# Patient Record
Sex: Female | Born: 1970 | Race: Black or African American | Hispanic: No | Marital: Single | Smoking: Never smoker
Health system: Southern US, Community
[De-identification: ages and names within clinical notes are randomized; demographics above are authoritative.]

## PROBLEM LIST (undated history)

## (undated) DIAGNOSIS — I10 Essential (primary) hypertension: Secondary | ICD-10-CM

## (undated) HISTORY — PX: ABDOMINAL HYSTERECTOMY: SHX81

---

## 2013-12-16 ENCOUNTER — Encounter (HOSPITAL_COMMUNITY): Payer: Self-pay | Admitting: Emergency Medicine

## 2013-12-16 ENCOUNTER — Emergency Department (HOSPITAL_COMMUNITY): Payer: No Typology Code available for payment source

## 2013-12-16 ENCOUNTER — Emergency Department (HOSPITAL_COMMUNITY)
Admission: EM | Admit: 2013-12-16 | Discharge: 2013-12-16 | Disposition: A | Discharge status: Home / Self Care | Payer: No Typology Code available for payment source | Attending: Emergency Medicine | Admitting: Emergency Medicine

## 2013-12-16 DIAGNOSIS — I10 Essential (primary) hypertension: Secondary | ICD-10-CM | POA: Insufficient documentation

## 2013-12-16 DIAGNOSIS — Y9389 Activity, other specified: Secondary | ICD-10-CM | POA: Diagnosis not present

## 2013-12-16 DIAGNOSIS — S8012XA Contusion of left lower leg, initial encounter: Secondary | ICD-10-CM | POA: Insufficient documentation

## 2013-12-16 DIAGNOSIS — Y998 Other external cause status: Secondary | ICD-10-CM | POA: Diagnosis not present

## 2013-12-16 DIAGNOSIS — S40022A Contusion of left upper arm, initial encounter: Secondary | ICD-10-CM | POA: Diagnosis not present

## 2013-12-16 DIAGNOSIS — S5012XA Contusion of left forearm, initial encounter: Secondary | ICD-10-CM | POA: Diagnosis not present

## 2013-12-16 DIAGNOSIS — Y92410 Unspecified street and highway as the place of occurrence of the external cause: Secondary | ICD-10-CM | POA: Insufficient documentation

## 2013-12-16 DIAGNOSIS — T148XXA Other injury of unspecified body region, initial encounter: Secondary | ICD-10-CM

## 2013-12-16 DIAGNOSIS — Z79899 Other long term (current) drug therapy: Secondary | ICD-10-CM | POA: Insufficient documentation

## 2013-12-16 DIAGNOSIS — R110 Nausea: Secondary | ICD-10-CM | POA: Diagnosis present

## 2013-12-16 HISTORY — DX: Essential (primary) hypertension: I10

## 2013-12-16 MED ORDER — HYDROCODONE-ACETAMINOPHEN 5-325 MG PO TABS
1.0000 | ORAL_TABLET | Freq: Once | ORAL | Status: AC
  Administered 2013-12-16: 1 via ORAL
  Filled 2013-12-16: qty 1

## 2013-12-16 MED ORDER — HYDROCODONE-ACETAMINOPHEN 5-325 MG PO TABS: 1.0000 | ORAL_TABLET | Freq: Once | ORAL | Status: DC

## 2013-12-16 MED ORDER — HYDROCODONE-ACETAMINOPHEN 5-325 MG PO TABS: 1.0000 | ORAL_TABLET | Freq: Four times a day (QID) | ORAL | Status: DC | PRN

## 2013-12-16 MED ORDER — IBUPROFEN 600 MG PO TABS: 600.0000 mg | ORAL_TABLET | Freq: Four times a day (QID) | ORAL | Status: DC | PRN

## 2013-12-16 MED ORDER — IBUPROFEN 200 MG PO TABS
600.0000 mg | ORAL_TABLET | Freq: Once | ORAL | Status: AC
  Administered 2013-12-16: 600 mg via ORAL
  Filled 2013-12-16: qty 3
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
## 2013-12-16 NOTE — ED Notes (Signed)
Ice applied to left forearm
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
## 2016-02-24 DIAGNOSIS — E663 Overweight: Secondary | ICD-10-CM | POA: Diagnosis not present

## 2016-02-24 DIAGNOSIS — Z1231 Encounter for screening mammogram for malignant neoplasm of breast: Secondary | ICD-10-CM | POA: Diagnosis not present

## 2016-02-24 DIAGNOSIS — Z01411 Encounter for gynecological examination (general) (routine) with abnormal findings: Secondary | ICD-10-CM | POA: Diagnosis not present

## 2016-03-01 DIAGNOSIS — R52 Pain, unspecified: Secondary | ICD-10-CM | POA: Diagnosis not present

## 2016-03-12 DIAGNOSIS — N6489 Other specified disorders of breast: Secondary | ICD-10-CM | POA: Diagnosis not present

## 2016-03-12 DIAGNOSIS — Z1231 Encounter for screening mammogram for malignant neoplasm of breast: Secondary | ICD-10-CM | POA: Diagnosis not present

## 2017-01-27 DIAGNOSIS — Z0000 Encounter for general adult medical examination without abnormal findings: Secondary | ICD-10-CM | POA: Diagnosis not present

## 2017-01-27 DIAGNOSIS — Z01419 Encounter for gynecological examination (general) (routine) without abnormal findings: Secondary | ICD-10-CM | POA: Diagnosis not present

## 2017-01-27 DIAGNOSIS — Z1151 Encounter for screening for human papillomavirus (HPV): Secondary | ICD-10-CM | POA: Diagnosis not present

## 2017-01-27 DIAGNOSIS — Z131 Encounter for screening for diabetes mellitus: Secondary | ICD-10-CM | POA: Diagnosis not present

## 2017-01-27 DIAGNOSIS — Z130 Encounter for screening for diseases of the blood and blood-forming organs and certain disorders involving the immune mechanism: Secondary | ICD-10-CM | POA: Diagnosis not present

## 2017-01-27 DIAGNOSIS — Z6831 Body mass index (BMI) 31.0-31.9, adult: Secondary | ICD-10-CM | POA: Diagnosis not present

## 2017-01-27 DIAGNOSIS — Z13220 Encounter for screening for lipoid disorders: Secondary | ICD-10-CM | POA: Diagnosis not present

## 2017-01-27 DIAGNOSIS — B373 Candidiasis of vulva and vagina: Secondary | ICD-10-CM | POA: Diagnosis not present

## 2017-01-27 DIAGNOSIS — Z1329 Encounter for screening for other suspected endocrine disorder: Secondary | ICD-10-CM | POA: Diagnosis not present

## 2017-02-03 DIAGNOSIS — I10 Essential (primary) hypertension: Secondary | ICD-10-CM | POA: Diagnosis not present

## 2017-02-03 DIAGNOSIS — Z6835 Body mass index (BMI) 35.0-35.9, adult: Secondary | ICD-10-CM | POA: Diagnosis not present

## 2017-02-03 DIAGNOSIS — E668 Other obesity: Secondary | ICD-10-CM | POA: Diagnosis not present

## 2017-03-01 DIAGNOSIS — E668 Other obesity: Secondary | ICD-10-CM | POA: Diagnosis not present

## 2017-03-01 DIAGNOSIS — I10 Essential (primary) hypertension: Secondary | ICD-10-CM | POA: Diagnosis not present

## 2017-03-01 DIAGNOSIS — Z6835 Body mass index (BMI) 35.0-35.9, adult: Secondary | ICD-10-CM | POA: Diagnosis not present

## 2017-03-17 DIAGNOSIS — Z1231 Encounter for screening mammogram for malignant neoplasm of breast: Secondary | ICD-10-CM | POA: Diagnosis not present

## 2017-05-12 DIAGNOSIS — Z6835 Body mass index (BMI) 35.0-35.9, adult: Secondary | ICD-10-CM | POA: Diagnosis not present

## 2017-05-12 DIAGNOSIS — M25511 Pain in right shoulder: Secondary | ICD-10-CM | POA: Diagnosis not present

## 2017-05-24 ENCOUNTER — Ambulatory Visit: Payer: BLUE CROSS/BLUE SHIELD | Admitting: Podiatry

## 2017-05-24 ENCOUNTER — Ambulatory Visit (INDEPENDENT_AMBULATORY_CARE_PROVIDER_SITE_OTHER): Payer: BLUE CROSS/BLUE SHIELD

## 2017-05-24 DIAGNOSIS — M79671 Pain in right foot: Secondary | ICD-10-CM

## 2017-05-24 DIAGNOSIS — M722 Plantar fascial fibromatosis: Secondary | ICD-10-CM | POA: Diagnosis not present

## 2017-05-24 NOTE — Patient Instructions (Signed)

## 2017-05-29 DIAGNOSIS — M722 Plantar fascial fibromatosis: Secondary | ICD-10-CM | POA: Insufficient documentation

## 2017-05-29 NOTE — Progress Notes (Signed)
Subjective:   Patient ID: Mary Guerrero, female   DOB: 47 y.o.   MRN: [id number]   HPI 47 year old female presents the office today for concerns of right heel pain which is been ongoing 1 to 2 weeks.  She states that the pain is worse if she sits down for some time she stands back up.  She denies any recent injury or trauma.  No numbness or tingling.  No swelling that she has noticed and no change in activity level.  She has no other concerns today.   Review of Systems  All other systems reviewed and are negative.  Past Medical History:  Diagnosis Date  . Hypertension     Past Surgical History:  Procedure Laterality Date  . ABDOMINAL HYSTERECTOMY  partial     Current Outpatient Medications:  .  hydrochlorothiazide (HYDRODIURIL) 25 MG tablet, Take 12.5 mg by mouth daily., Disp: , Rfl:  .  HYDROcodone-acetaminophen (NORCO/VICODIN) 5-325 MG per tablet, Take 1 tablet by mouth every 6 (six) hours as needed., Disp: 8 tablet, Rfl: 0 .  ibuprofen (ADVIL,MOTRIN) 600 MG tablet, Take 1 tablet (600 mg total) by mouth every 6 (six) hours as needed., Disp: 30 tablet, Rfl: 0 .  meloxicam (MOBIC) 15 MG tablet, , Disp: , Rfl: 0 .  olmesartan-hydrochlorothiazide (BENICAR HCT) 40-25 MG tablet, , Disp: , Rfl: 2  No Known Allergies  Social History   Socioeconomic History  . Marital status: Single    Spouse name: Not on file  . Number of children: Not on file  . Years of education: Not on file  . Highest education level: Not on file  Occupational History  . Not on file  Social Needs  . Financial resource strain: Not on file  . Food insecurity:    Worry: Not on file    Inability: Not on file  . Transportation needs:    Medical: Not on file    Non-medical: Not on file  Tobacco Use  . Smoking status: Never Smoker  Substance and Sexual Activity  . Alcohol use: Yes    Comment: occassional  . Drug use: No  . Sexual activity: Not on file  Lifestyle  . Physical activity:    Days per  week: Not on file    Minutes per session: Not on file  . Stress: Not on file  Relationships  . Social connections:    Talks on phone: Not on file    Gets together: Not on file    Attends religious service: Not on file    Active member of club or organization: Not on file    Attends meetings of clubs or organizations: Not on file    Relationship status: Not on file  . Intimate partner violence:    Fear of current or ex partner: Not on file    Emotionally abused: Not on file    Physically abused: Not on file    Forced sexual activity: Not on file  Other Topics Concern  . Not on file  Social History Narrative  . Not on file        Objective:  Physical Exam  General: AAO x3, NAD  Dermatological: Skin is warm, dry and supple bilateral. Nails x 10 are well manicured; remaining integument appears unremarkable at this time. There are no open sores, no preulcerative lesions, no rash or signs of infection present.  Vascular: Dorsalis Pedis artery and Posterior Tibial artery pedal pulses are 2/4 bilateral with immedate capillary fill time. Pedal hair  growth present. No varicosities and no lower extremity edema present bilateral. There is no pain with calf compression, swelling, warmth, erythema.   Neruologic: Grossly intact via light touch bilateral. Vibratory intact via tuning fork bilateral. Protective threshold with Semmes Wienstein monofilament intact to all pedal sites bilateral.  Negative Tinel sign  Musculoskeletal: Tenderness to palpation along the plantar medial tubercle of the calcaneus at the insertion of plantar fascia on the right foot. There is no pain along the course of the plantar fascia within the arch of the foot. Plantar fascia appears to be intact. There is no pain with lateral compression of the calcaneus or pain with vibratory sensation. There is no pain along the course or insertion of the achilles tendon. No other areas of tenderness to bilateral lower extremities.  Muscular strength 5/5 in all groups tested bilateral.  Gait: Unassisted, Nonantalgic.       Assessment:   Right heel pain, plantar fasciitis     Plan:  -Treatment options discussed including all alternatives, risks, and complications -Etiology of symptoms were discussed -X-rays were obtained and reviewed with the patient.  No evidence of acute fracture or stress fracture identified today. -Plantar fascial injection performed.  See procedure note below. -Plantar fascial brace dispensed -Prescribed mobic. Discussed side effects of the medication and directed to stop if any are to occur and call the office.  -Stretching, icing exercises daily -Discussed shoe modifications and orthotics -RTC 3 weeks or sooner if needed  Procedure: Injection Tendon/Ligament Discussed alternatives, risks, complications and verbal consent was obtained.  Location: Right plantar fascia at the glabrous junction; medial approach. Skin Prep: Alcohol. Injectate: 0.5cc 0.5% marcaine plain, 0.5 cc 2% lidocaine plain and, 1 cc kenalog 10. Disposition: Patient tolerated procedure well. Injection site dressed with a band-aid.  Post-injection care was discussed and return precautions discussed.     Vivi Barrack DPM

## 2017-05-31 DIAGNOSIS — Z6834 Body mass index (BMI) 34.0-34.9, adult: Secondary | ICD-10-CM | POA: Diagnosis not present

## 2017-05-31 DIAGNOSIS — E669 Obesity, unspecified: Secondary | ICD-10-CM | POA: Diagnosis not present

## 2017-05-31 DIAGNOSIS — I10 Essential (primary) hypertension: Secondary | ICD-10-CM | POA: Diagnosis not present

## 2017-06-07 ENCOUNTER — Ambulatory Visit
Admission: RE | Admit: 2017-06-07 | Discharge: 2017-06-07 | Disposition: A | Discharge status: Home / Self Care | Payer: BLUE CROSS/BLUE SHIELD | Source: Ambulatory Visit | Attending: Family Medicine | Admitting: Family Medicine

## 2017-06-07 ENCOUNTER — Other Ambulatory Visit: Payer: Self-pay | Admitting: Family Medicine

## 2017-06-07 DIAGNOSIS — J189 Pneumonia, unspecified organism: Secondary | ICD-10-CM | POA: Diagnosis not present

## 2017-06-07 DIAGNOSIS — R05 Cough: Secondary | ICD-10-CM

## 2017-06-07 DIAGNOSIS — R062 Wheezing: Secondary | ICD-10-CM | POA: Diagnosis not present

## 2017-06-07 DIAGNOSIS — R059 Cough, unspecified: Secondary | ICD-10-CM

## 2017-06-07 DIAGNOSIS — R509 Fever, unspecified: Secondary | ICD-10-CM | POA: Diagnosis not present

## 2017-06-07 DIAGNOSIS — Z6833 Body mass index (BMI) 33.0-33.9, adult: Secondary | ICD-10-CM | POA: Diagnosis not present

## 2017-06-16 ENCOUNTER — Ambulatory Visit: Payer: BLUE CROSS/BLUE SHIELD | Admitting: Podiatry

## 2017-06-20 ENCOUNTER — Ambulatory Visit: Payer: BLUE CROSS/BLUE SHIELD | Admitting: Podiatry

## 2017-09-08 ENCOUNTER — Telehealth: Payer: Self-pay | Admitting: Podiatry

## 2017-09-08 ENCOUNTER — Encounter: Payer: Self-pay | Admitting: Podiatry

## 2017-09-08 ENCOUNTER — Ambulatory Visit: Payer: BLUE CROSS/BLUE SHIELD | Admitting: Podiatry

## 2017-09-08 DIAGNOSIS — M722 Plantar fascial fibromatosis: Secondary | ICD-10-CM

## 2017-09-08 MED ORDER — METHYLPREDNISOLONE 4 MG PO TBPK: ORAL_TABLET | ORAL | 0 refills | Status: DC

## 2017-09-08 MED ORDER — TRIAMCINOLONE ACETONIDE 10 MG/ML IJ SUSP
10.0000 mg | Freq: Once | INTRAMUSCULAR | Status: AC
  Administered 2017-09-08: 10 mg

## 2017-09-08 NOTE — Progress Notes (Signed)
Subjective: 47 year old female presents the office today for follow-up evaluation of right heel pain, plantar fasciitis.  She states the steroid injection did help for some time however the pain did come back.  She did get the new shoes and inserts which has not been helpful.  She has been on her foot on a frozen water bottle.  Medications doing well, anti-inflammatories.  Overall her pain is about the same at this point.  She has no other concerns.  No recent injury or changes. Denies any systemic complaints such as fevers, chills, nausea, vomiting. No acute changes since last appointment, and no other complaints at this time.   Objective: AAO x3, NAD DP/PT pulses palpable bilaterally, CRT less than 3 seconds There is continuation of tenderness to palpation on the plantar medial tubercle of the [place] the insertion of plantar fascia as well as slightly proximal to this area as well.  There is no pain with lateral compression of the calcaneus and there is no pain to vibratory sensation.  There is no pain on the course or insertion of the Achilles tendon.  Thompson test is negative.  Equinus is present. No open lesions or pre-ulcerative lesions.  No pain with calf compression, swelling, warmth, erythema  Assessment: Right heel pain, plantar fasciitis  Plan: -All treatment options discussed with the patient including all alternatives, risks, complications.  -Today second steroid injection was performed.  See procedure note below.  I dispensed a night splint as well.  Discussed wearing supportive shoes and rest when check with orthotic coverage in regards to getting custom orthotics made for her.  Plan continue with stretching, icing, anti-inflammatories, plantar fascial brace every day for now as well.  I will see her back in 3 weeks to see how she is doing or sooner if any issues are to arise. -Patient encouraged to call the office with any questions, concerns, change in symptoms.   Procedure:  Injection Tendon/Ligament Discussed alternatives, risks, complications and verbal consent was obtained.  Location: Right plantar fascia at the glabrous junction; medial approach. Skin Prep: Alcohol. Injectate: 0.5cc 0.5% marcaine plain, 0.5 cc 2% lidocaine plain and, 1 cc kenalog 10. Disposition: Patient tolerated procedure well. Injection site dressed with a band-aid.  Post-injection care was discussed and return precautions discussed.   Vivi BarrackMatthew R Mahlet Jergens DPM

## 2017-09-08 NOTE — Telephone Encounter (Signed)
RX was suppose to be called into [facility] on the [address] of [address]. Was called into wrong location.

## 2017-09-22 ENCOUNTER — Ambulatory Visit: Payer: BLUE CROSS/BLUE SHIELD | Admitting: Podiatry

## 2017-09-22 ENCOUNTER — Ambulatory Visit (INDEPENDENT_AMBULATORY_CARE_PROVIDER_SITE_OTHER): Payer: BLUE CROSS/BLUE SHIELD | Admitting: Orthotics

## 2017-09-22 DIAGNOSIS — B351 Tinea unguium: Secondary | ICD-10-CM

## 2017-09-22 DIAGNOSIS — M722 Plantar fascial fibromatosis: Secondary | ICD-10-CM

## 2017-09-22 MED ORDER — METHYLPREDNISOLONE 4 MG PO TBPK: ORAL_TABLET | ORAL | 0 refills | Status: DC

## 2017-09-22 NOTE — Progress Notes (Signed)

## 2017-09-26 NOTE — Progress Notes (Signed)
Subjective: 47 year old female presents today requesting orthotics she presents to get motivated inserts.  She states that she still has some pain to the heel.  She has been doing stretching icing exercises regularly.  She also states that she has some toenail fungus and she is asked about different treatment options for this.  Denies any pain in the nails and denies any redness or drainage. Denies any systemic complaints such as fevers, chills, nausea, vomiting. No acute changes since last appointment, and no other complaints at this time.   Objective: AAO x3, NAD DP/PT pulses palpable bilaterally, CRT less than 3 seconds Overall there is continuation of tenderness on the plantar tubercle of the calcaneus and insertion of plantar fascia.  There is no other areas of tenderness.  In terms of his mild yellow discoloration there is callus in the nails I cannot fully evaluate him today.  No open lesions or pre-ulcerative lesions.  No pain with calf compression, swelling, warmth, erythema  Assessment: Chronic heel pain, plantar; likely early onychomycosis  Plan: -All treatment options discussed with the patient including all alternatives, risks, complications.  -At this point she wants to go to proceed with orthotics and she was measured for these today.  Continue stretching, icing exercises daily. -Regards to nail fungus we discussed options as using steroid topical treatment.  She is instructed over-the-counter Fungi-Nail to continue with more prescription topical if needed or oral medications. -Patient encouraged to call the office with any questions, concerns, change in symptoms.   Vivi Barrack DPM

## 2017-10-13 ENCOUNTER — Ambulatory Visit: Payer: BLUE CROSS/BLUE SHIELD | Admitting: Orthotics

## 2017-10-13 DIAGNOSIS — M722 Plantar fascial fibromatosis: Secondary | ICD-10-CM

## 2017-10-13 NOTE — Progress Notes (Signed)
Patient came in today to pick up custom made foot orthotics.  She could not stay to be final fitted as she had other appointments. Gave her f/o and told her to ck back in if needed.

## 2017-12-01 DIAGNOSIS — E668 Other obesity: Secondary | ICD-10-CM | POA: Diagnosis not present

## 2017-12-01 DIAGNOSIS — Z6835 Body mass index (BMI) 35.0-35.9, adult: Secondary | ICD-10-CM | POA: Diagnosis not present

## 2017-12-01 DIAGNOSIS — I10 Essential (primary) hypertension: Secondary | ICD-10-CM | POA: Diagnosis not present

## 2018-10-03 ENCOUNTER — Emergency Department (HOSPITAL_COMMUNITY)
Admission: EM | Admit: 2018-10-03 | Discharge: 2018-10-03 | Discharge status: Against Medical Advice | Payer: 59 | Attending: Emergency Medicine | Admitting: Emergency Medicine

## 2018-10-03 ENCOUNTER — Encounter (HOSPITAL_COMMUNITY): Payer: Self-pay

## 2018-10-03 ENCOUNTER — Other Ambulatory Visit: Payer: Self-pay

## 2018-10-03 DIAGNOSIS — Z5321 Procedure and treatment not carried out due to patient leaving prior to being seen by health care provider: Secondary | ICD-10-CM | POA: Insufficient documentation

## 2018-10-03 DIAGNOSIS — R51 Headache: Secondary | ICD-10-CM | POA: Diagnosis present

## 2018-10-03 NOTE — ED Notes (Signed)
When going in to take patient back to a room. She stated that she was leaving because we took too long. Pt LWBS.

## 2018-10-03 NOTE — ED Triage Notes (Signed)
Pt arrives with [facility] from work c/o of headache x6 hours. She states that she has had this type of headache before and it is usually is caused by HTN. Denies vision changes. Denies N/V. EKG unremarkable with EMS.

## 2018-11-04 ENCOUNTER — Emergency Department (HOSPITAL_COMMUNITY)
Admission: EM | Admit: 2018-11-04 | Discharge: 2018-11-04 | Disposition: A | Discharge status: Home / Self Care | Payer: 59 | Attending: Emergency Medicine | Admitting: Emergency Medicine

## 2018-11-04 DIAGNOSIS — W25XXXA Contact with sharp glass, initial encounter: Secondary | ICD-10-CM | POA: Diagnosis not present

## 2018-11-04 DIAGNOSIS — Y93E5 Activity, floor mopping and cleaning: Secondary | ICD-10-CM | POA: Diagnosis not present

## 2018-11-04 DIAGNOSIS — Z79899 Other long term (current) drug therapy: Secondary | ICD-10-CM | POA: Diagnosis not present

## 2018-11-04 DIAGNOSIS — I10 Essential (primary) hypertension: Secondary | ICD-10-CM | POA: Diagnosis not present

## 2018-11-04 DIAGNOSIS — Y999 Unspecified external cause status: Secondary | ICD-10-CM | POA: Insufficient documentation

## 2018-11-04 DIAGNOSIS — S81811A Laceration without foreign body, right lower leg, initial encounter: Secondary | ICD-10-CM | POA: Insufficient documentation

## 2018-11-04 DIAGNOSIS — Y929 Unspecified place or not applicable: Secondary | ICD-10-CM | POA: Insufficient documentation

## 2018-11-04 NOTE — ED Provider Notes (Signed)
[facility] EMERGENCY DEPARTMENT Provider Note   CSN: [id number] Arrival date & time: 11/04/18  1403     History   Chief Complaint No chief complaint on file.   HPI Mary Guerrero is a 48 y.o. female.     48 year old female presents with complaint of right lower leg laceration.  Patient states that she was cleaning up broken glass at work and when she went to carry out the trash bag the glass broke through the bag and cut her right lower leg.  [facility] and area was anesthetized before urgent care provider decided she needed to have her wound closed in the emergency room.  Tetanus is up-to-date, bleeding is controlled on arrival.  No other injuries, complaints, concerns.     Past Medical History:  Diagnosis Date  . Hypertension     Patient Active Problem List   Diagnosis Date Noted  . Plantar fasciitis 05/29/2017    Past Surgical History:  Procedure Laterality Date  . ABDOMINAL HYSTERECTOMY  partial     OB History   No obstetric history on file.      Home Medications    Prior to Admission medications   Medication Sig Start Date End Date Taking? Authorizing Provider  hydrochlorothiazide (HYDRODIURIL) 25 MG tablet Take 12.5 mg by mouth daily.    [provider]  methylPREDNISolone (MEDROL DOSEPAK) 4 MG TBPK tablet Take as directed 09/22/17   Vivi Barrack, DPM    Family History No family history on file.  Social History Social History   Tobacco Use  . Smoking status: Never Smoker  . Smokeless tobacco: Never Used  Substance Use Topics  . Alcohol use: Yes    Comment: occassional  . Drug use: No     Allergies   Patient has no known allergies.   Review of Systems Review of Systems  Constitutional: Negative for fever.  Musculoskeletal: Negative for arthralgias, gait problem, joint swelling and myalgias.  Skin: Positive for wound.  Allergic/Immunologic: Negative for immunocompromised  state.  Neurological: Negative for weakness and numbness.  Hematological: Does not bruise/bleed easily.  All other systems reviewed and are negative.    Physical Exam Updated Vital Signs BP (!) 119/92 (BP Location: Right Arm)   Pulse 80   Temp 98.2 F (36.8 C) (Oral)   Resp 20   SpO2 97%   Physical Exam Vitals signs and nursing note reviewed.  Constitutional:      General: She is not in acute distress.    Appearance: She is well-developed. She is not diaphoretic.  HENT:     Head: Normocephalic and atraumatic.  Pulmonary:     Effort: Pulmonary effort is normal.  Musculoskeletal: Normal range of motion.        General: Signs of injury present. No swelling, tenderness or deformity.     Right lower leg: She exhibits laceration. She exhibits no tenderness, no bony tenderness and no swelling.       Legs:  Skin:    General: Skin is warm and dry.     Findings: No erythema or rash.  Neurological:     Mental Status: She is alert and oriented to person, place, and time.  Psychiatric:        Behavior: Behavior normal.      ED Treatments / Results  Labs (all labs ordered are listed, but only abnormal results are displayed) Labs Reviewed - No data to display  EKG None  Radiology No  results found.  Procedures .Marland KitchenLaceration Repair  Date/Time: 11/04/2018 2:35 PM Performed by: Tacy Learn, PA-C Authorized by: Tacy Learn, PA-C   Consent:    Consent obtained:  Verbal   Consent given by:  Patient   Risks discussed:  Infection, need for additional repair, pain, poor cosmetic result and poor wound healing   Alternatives discussed:  No treatment and delayed treatment Universal protocol:    Procedure explained and questions answered to patient or proxy's satisfaction: yes     Relevant documents present and verified: yes     Test results available and properly labeled: yes     Imaging studies available: yes     Required blood products, implants, devices, and special  equipment available: yes     Site/side marked: yes     Immediately prior to procedure, a time out was called: yes     Patient identity confirmed:  Verbally with patient Anesthesia (see MAR for exact dosages):    Anesthesia method:  Local infiltration   Local anesthetic:  Lidocaine 1% WITH epi Laceration details:    Location:  Leg   Leg location:  R lower leg   Length (cm):  5   Depth (mm):  3 Repair type:    Repair type:  Simple Pre-procedure details:    Preparation:  Patient was prepped and draped in usual sterile fashion Exploration:    Hemostasis achieved with:  Epinephrine   Wound exploration: wound explored through full range of motion and entire depth of wound probed and visualized     Wound extent: no fascia violation noted, no foreign bodies/material noted, no muscle damage noted, no nerve damage noted, no tendon damage noted, no underlying fracture noted and no vascular damage noted     Contaminated: no   Treatment:    Area cleansed with:  Saline   Amount of cleaning:  Standard   Irrigation solution:  Sterile saline Skin repair:    Repair method:  Sutures and Steri-Strips   Suture size:  4-0   Suture material:  Nylon   Suture technique:  Horizontal mattress   Number of sutures:  3   Number of Steri-Strips:  4 Approximation:    Approximation:  Close Post-procedure details:    Dressing:  Antibiotic ointment and bulky dressing   Patient tolerance of procedure:  Tolerated well, no immediate complications   (including critical care time)  Medications Ordered in ED Medications - No data to display   Initial Impression / Assessment and Plan / ED Course  I have reviewed the triage vital signs and the nursing notes.  Pertinent labs & imaging results that were available during my care of the patient were reviewed by me and considered in my medical decision making (see chart for details).  Clinical Course as of Nov 03 1436  Sat Nov 04, 5351  1863 48 year old female  presents with complaint of right lower leg laceration.  On exam patient is a 5 cm laceration to her right lower leg medially, bleeding is controlled prior to arrival.  Tetanus is up-to-date.  Wound was closed with 3 mattress sutures and 4 Steri-Strips.  Recommend wound check in 2 days, suture removal in 12 days.   [LM]    Clinical Course User Index [LM] Tacy Learn, PA-C      Final Clinical Impressions(s) / ED Diagnoses   Final diagnoses:  Laceration of right lower extremity, initial encounter    ED Discharge Orders    None  Jeannie FendMurphy, Elease Swarm A, PA-C 11/04/18 1438    Raeford RazorKohut, Stephen, MD 11/04/18 1843

## 2018-11-04 NOTE — Discharge Instructions (Addendum)
Keep wound clean and dry.  You may clean with Dove soap on a Q-tip as needed.  Do not use peroxide. You should have a wound check in 2 days with your primary care provider, suture removal in 12 days. Avoid flexing your knee which may pull your stitches tight.

## 2018-11-04 NOTE — ED Triage Notes (Signed)
Pt here from [facility] to get stitches to a a leg lac

## 2020-01-10 IMAGING — CR DG CHEST 2V
2 series · 2 of 2 positions shown · non-contrast
Comparison: None.

CLINICAL DATA: Cough, fever.

EXAM:
CHEST - 2 VIEW

[w chest lat]
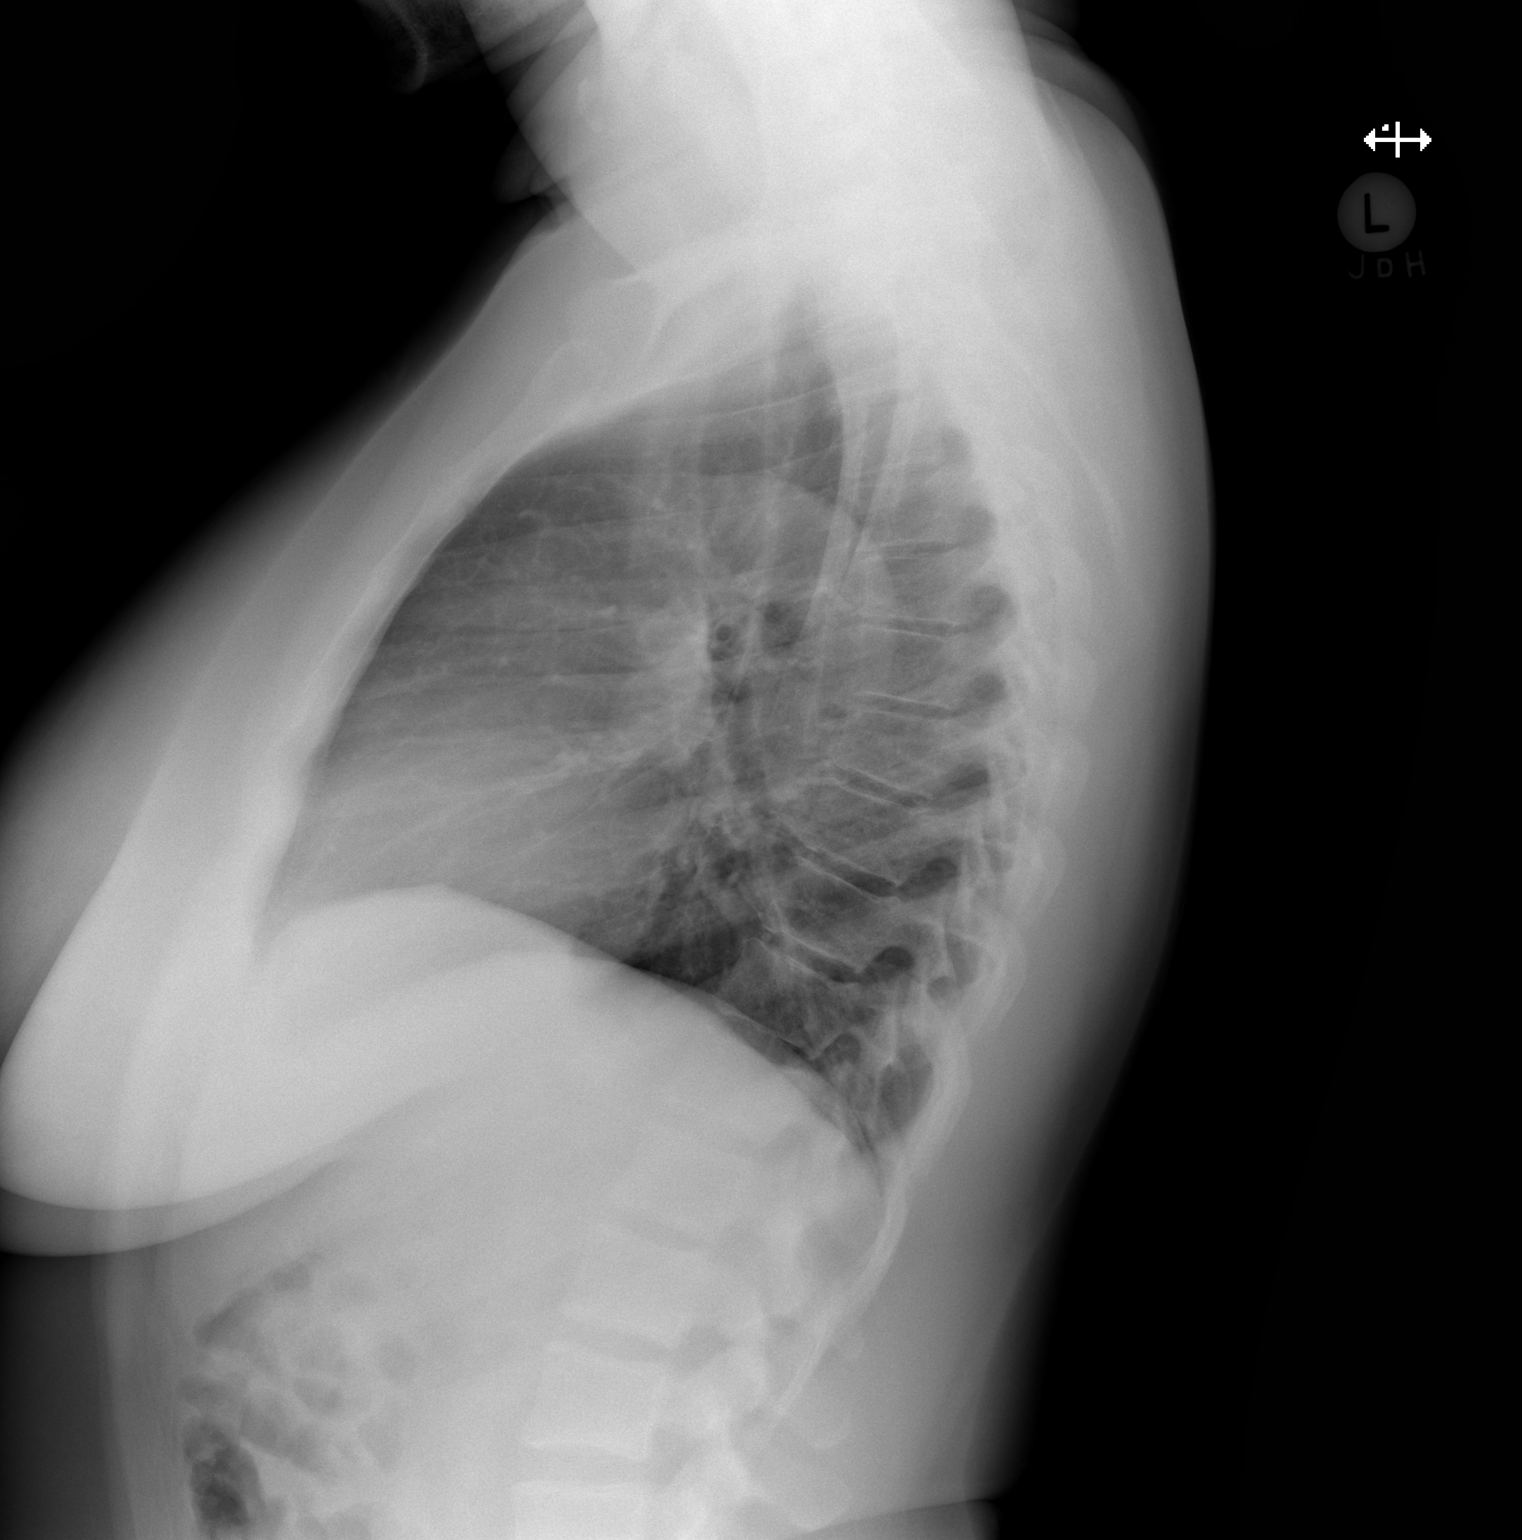

[w chest pa]
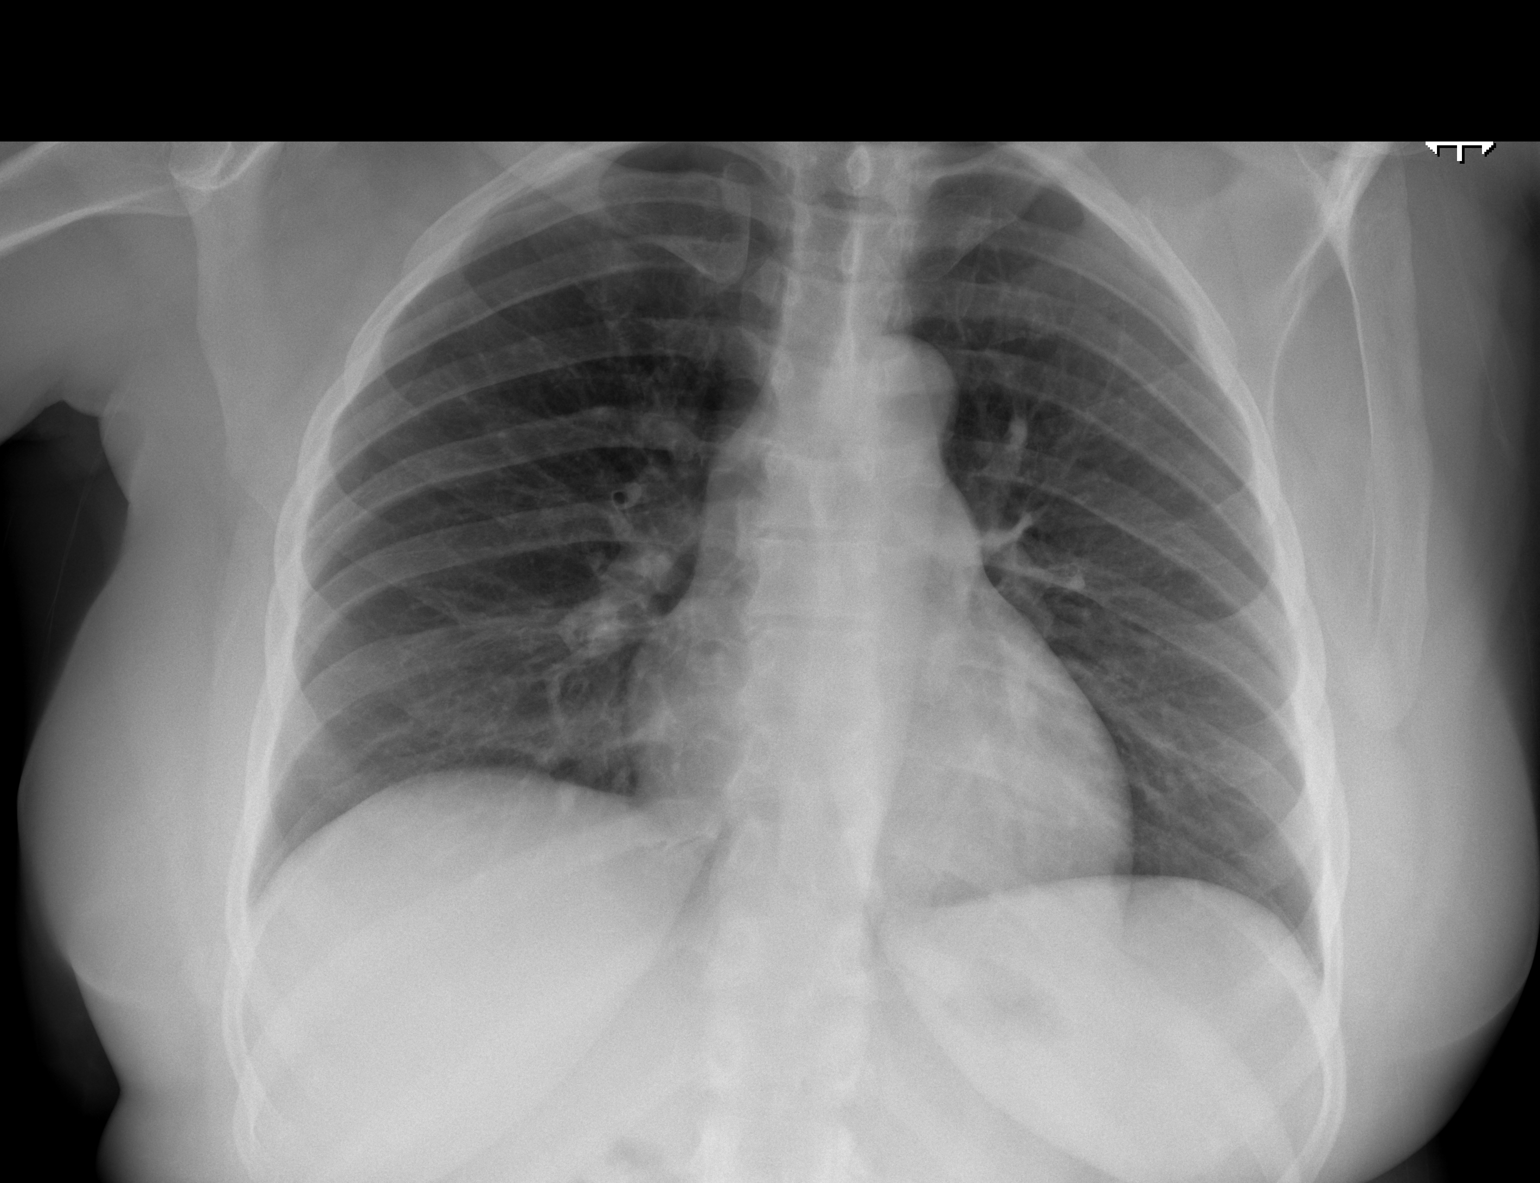

[2 of 2 positions shown; findings below may reference images not displayed]

FINDINGS: The heart size and mediastinal contours are within normal limits.
Both lungs are clear. No pneumothorax or pleural effusion is noted.
The visualized skeletal structures are unremarkable.
IMPRESSION: No active cardiopulmonary disease.

## 2021-06-20 ENCOUNTER — Emergency Department (HOSPITAL_COMMUNITY)
Admission: EM | Admit: 2021-06-20 | Discharge: 2021-06-20 | Disposition: A | Discharge status: Home / Self Care | Payer: 59 | Attending: Emergency Medicine | Admitting: Emergency Medicine

## 2021-06-20 DIAGNOSIS — M533 Sacrococcygeal disorders, not elsewhere classified: Secondary | ICD-10-CM | POA: Insufficient documentation

## 2021-06-20 DIAGNOSIS — M549 Dorsalgia, unspecified: Secondary | ICD-10-CM | POA: Diagnosis present

## 2021-06-20 MED ORDER — HYDROCODONE-ACETAMINOPHEN 5-325 MG PO TABS
1.0000 | ORAL_TABLET | Freq: Once | ORAL | Status: AC
  Administered 2021-06-20: 1 via ORAL
  Filled 2021-06-20: qty 1

## 2021-06-20 MED ORDER — CYCLOBENZAPRINE HCL 10 MG PO TABS: 5.0000 mg | ORAL_TABLET | Freq: Two times a day (BID) | ORAL | 0 refills | Status: DC | PRN

## 2021-06-20 MED ORDER — ONDANSETRON 4 MG PO TBDP
4.0000 mg | ORAL_TABLET | Freq: Once | ORAL | Status: AC
  Administered 2021-06-20: 4 mg via ORAL
  Filled 2021-06-20: qty 1

## 2021-06-20 MED ORDER — OXYCODONE-ACETAMINOPHEN 5-325 MG PO TABS: 1.0000 | ORAL_TABLET | ORAL | Status: DC | PRN

## 2021-06-20 MED ORDER — CELECOXIB 200 MG PO CAPS
200.0000 mg | ORAL_CAPSULE | Freq: Two times a day (BID) | ORAL | 0 refills | Status: DC
Start: 2021-06-20 — End: 2023-01-11

## 2021-06-20 NOTE — ED Provider Notes (Signed)
[facility] EMERGENCY DEPARTMENT Provider Note   CSN: [id number] Arrival date & time: 06/20/21  2134     History  Chief Complaint  Patient presents with   Back Pain    Mary Guerrero is a 51 y.o. female who presents emergency department with chief complaint of low back pain.  Patient complains of pain over the right SI joint.  She states that she had onset of significantly painful back pain 1 week ago.  She pushes a heavy cart at work.  She states that this morning the pain came back on.  She describes it as aching, sharp.  It is specifically localized over the right SI joint.  Pain does not radiate.  She denies leg weakness, saddle anesthesia, injury.  Pain is worsened when she sits for long period time, bends forward or pushes a heavy cart.   Back Pain     Home Medications Prior to Admission medications   Medication Sig Start Date End Date Taking? Authorizing Provider  hydrochlorothiazide (HYDRODIURIL) 25 MG tablet Take 12.5 mg by mouth daily.    [provider]  methylPREDNISolone (MEDROL DOSEPAK) 4 MG TBPK tablet Take as directed 09/22/17   Trula Slade, DPM      Allergies    Patient has no known allergies.    Review of Systems   Review of Systems  Musculoskeletal:  Positive for back pain.   Physical Exam Updated Vital Signs BP 132/80 (BP Location: Right Arm)   Pulse 94   Temp 98.6 F (37 C) (Oral)   Resp 18   SpO2 99%  Physical Exam Vitals and nursing note reviewed.  Constitutional:      General: She is not in acute distress.    Appearance: She is well-developed. She is not diaphoretic.  HENT:     Head: Normocephalic and atraumatic.     Right Ear: External ear normal.     Left Ear: External ear normal.     Nose: Nose normal.     Mouth/Throat:     Mouth: Mucous membranes are moist.  Eyes:     General: No scleral icterus.    Conjunctiva/sclera: Conjunctivae normal.  Cardiovascular:     Rate and Rhythm: Normal rate and  regular rhythm.     Heart sounds: Normal heart sounds. No murmur heard.   No friction rub. No gallop.  Pulmonary:     Effort: Pulmonary effort is normal. No respiratory distress.     Breath sounds: Normal breath sounds.  Abdominal:     General: Bowel sounds are normal. There is no distension.     Palpations: Abdomen is soft. There is no mass.     Tenderness: There is no abdominal tenderness. There is no guarding.  Musculoskeletal:     Cervical back: Normal range of motion.       Back:     Comments: Pain and tenderness over the right SI joint range of motion limited normal leg strength  Skin:    General: Skin is warm and dry.  Neurological:     Mental Status: She is alert and oriented to person, place, and time.  Psychiatric:        Behavior: Behavior normal.    ED Results / Procedures / Treatments   Labs (all labs ordered are listed, but only abnormal results are displayed) Labs Reviewed - No data to display  EKG None  Radiology No results found.  Procedures Procedures    Medications Ordered in ED Medications  oxyCODONE-acetaminophen (PERCOCET/ROXICET) 5-325 MG per tablet 1 tablet (has no administration in time range)    ED Course/ Medical Decision Making/ A&P                           Medical Decision Making Patient with back pain.  No neurological deficits and normal neuro exam.  Patient can walk but states is painful.  No loss of bowel or bladder control.  No concern for cauda equina.  No fever, night sweats, weight loss, h/o cancer, IVDU.  RICE protocol and pain medicine indicated and discussed with patient.    Problems Addressed: Sacroiliac joint pain: chronic illness or injury with exacerbation, progression, or side effects of treatment  Risk Prescription drug management.    Final Clinical Impression(s) / ED Diagnoses Final diagnoses:  Sacroiliac joint pain    Rx / DC Orders ED Discharge Orders     None         Margarita Mail,  PA-C 06/20/21 2259    Sherwood Gambler, MD 06/30/21 [phone]

## 2021-06-20 NOTE — Discharge Instructions (Signed)
Get help right away if: You have weakness, numbness, or tingling in your legs or feet. You lose control of your bladder or bowels.

## 2021-06-20 NOTE — ED Notes (Signed)
Patient verbalizes understanding of discharge instructions. Opportunity for questioning and answers were provided. Armband removed by staff, pt discharged from ED to home 

## 2021-06-20 NOTE — ED Triage Notes (Signed)
Pt presents stating she has chronic back pain and today was worse. Was lying on the couch and felt what she describes as "spasm" in the right lower back.

## 2022-01-19 ENCOUNTER — Encounter (HOSPITAL_COMMUNITY): Payer: Self-pay

## 2022-01-19 ENCOUNTER — Emergency Department (HOSPITAL_COMMUNITY): Payer: 59

## 2022-01-19 ENCOUNTER — Emergency Department (HOSPITAL_COMMUNITY)
Admission: EM | Admit: 2022-01-19 | Discharge: 2022-01-19 | Disposition: A | Discharge status: Home / Self Care | Payer: 59 | Attending: Emergency Medicine | Admitting: Emergency Medicine

## 2022-01-19 ENCOUNTER — Other Ambulatory Visit: Payer: Self-pay

## 2022-01-19 DIAGNOSIS — M549 Dorsalgia, unspecified: Secondary | ICD-10-CM | POA: Insufficient documentation

## 2022-01-19 DIAGNOSIS — I10 Essential (primary) hypertension: Secondary | ICD-10-CM | POA: Diagnosis not present

## 2022-01-19 DIAGNOSIS — Y92481 Parking lot as the place of occurrence of the external cause: Secondary | ICD-10-CM | POA: Diagnosis not present

## 2022-01-19 DIAGNOSIS — R0789 Other chest pain: Secondary | ICD-10-CM | POA: Diagnosis present

## 2022-01-19 DIAGNOSIS — Z95810 Presence of automatic (implantable) cardiac defibrillator: Secondary | ICD-10-CM | POA: Diagnosis not present

## 2022-01-19 DIAGNOSIS — E876 Hypokalemia: Secondary | ICD-10-CM | POA: Diagnosis not present

## 2022-01-19 DIAGNOSIS — Z79899 Other long term (current) drug therapy: Secondary | ICD-10-CM | POA: Diagnosis not present

## 2022-01-19 LAB — BASIC METABOLIC PANEL
Anion gap: 10 (ref 5–15)
BUN: 15 mg/dL (ref 6–20)
CO2: 22 mmol/L (ref 22–32)
Calcium: 8.7 mg/dL — ABNORMAL LOW (ref 8.9–10.3)
Chloride: 106 mmol/L (ref 98–111)
Creatinine, Ser: 0.71 mg/dL (ref 0.44–1.00)
GFR, Estimated: >60 mL/min (ref >60)
Glucose, Bld: 110 mg/dL — ABNORMAL HIGH (ref 70–99)
Potassium: 3.2 mmol/L — ABNORMAL LOW (ref 3.5–5.1)
Sodium: 138 mmol/L (ref 135–145)

## 2022-01-19 LAB — CBC WITH DIFFERENTIAL/PLATELET
Abs Immature Granulocytes: 0.07 10*3/uL (ref 0.00–0.07)
Basophils Absolute: 0 10*3/uL (ref 0.0–0.1)
Basophils Relative: 0 %
Eosinophils Absolute: 0.3 10*3/uL (ref 0.0–0.5)
Eosinophils Relative: 3 %
HCT: 43 % (ref 36.0–46.0)
Hemoglobin: 13.1 g/dL (ref 12.0–15.0)
Immature Granulocytes: 1 %
Lymphocytes Relative: 22 %
Lymphs Abs: 2 10*3/uL (ref 0.7–4.0)
MCH: 24.3 pg — ABNORMAL LOW (ref 26.0–34.0)
MCHC: 30.5 g/dL (ref 30.0–36.0)
MCV: 79.9 fL — ABNORMAL LOW (ref 80.0–100.0)
Monocytes Absolute: 0.6 10*3/uL (ref 0.1–1.0)
Monocytes Relative: 6 %
Neutro Abs: 6.2 10*3/uL (ref 1.7–7.7)
Neutrophils Relative %: 68 %
Platelets: 285 10*3/uL (ref 150–400)
RBC: 5.38 MIL/uL — ABNORMAL HIGH (ref 3.87–5.11)
RDW: 14.9 % (ref 11.5–15.5)
WBC: 9.1 10*3/uL (ref 4.0–10.5)
nRBC: 0 % (ref 0.0–0.2)

## 2022-01-19 LAB — TROPONIN I (HIGH SENSITIVITY)
Troponin I (High Sensitivity): 4 ng/L (ref <18)
Troponin I (High Sensitivity): 6 ng/L (ref <18)

## 2022-01-19 MED ORDER — METHOCARBAMOL 500 MG PO TABS: 500.0000 mg | ORAL_TABLET | Freq: Two times a day (BID) | ORAL | 0 refills | Status: DC

## 2022-01-19 MED ORDER — ACETAMINOPHEN 500 MG PO TABS
1000.0000 mg | ORAL_TABLET | Freq: Once | ORAL | Status: AC
  Administered 2022-01-19: 1000 mg via ORAL
  Filled 2022-01-19: qty 2
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
## 2022-01-19 NOTE — ED Notes (Signed)
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
## 2023-01-11 ENCOUNTER — Encounter (HOSPITAL_BASED_OUTPATIENT_CLINIC_OR_DEPARTMENT_OTHER): Payer: Self-pay

## 2023-01-11 ENCOUNTER — Other Ambulatory Visit: Payer: Self-pay

## 2023-01-11 ENCOUNTER — Emergency Department (HOSPITAL_BASED_OUTPATIENT_CLINIC_OR_DEPARTMENT_OTHER)
Admission: EM | Admit: 2023-01-11 | Discharge: 2023-01-11 | Disposition: A | Discharge status: Home / Self Care | Payer: 59 | Attending: Emergency Medicine | Admitting: Emergency Medicine

## 2023-01-11 DIAGNOSIS — I10 Essential (primary) hypertension: Secondary | ICD-10-CM | POA: Insufficient documentation

## 2023-01-11 DIAGNOSIS — M79642 Pain in left hand: Secondary | ICD-10-CM | POA: Diagnosis present

## 2023-01-11 DIAGNOSIS — M25532 Pain in left wrist: Secondary | ICD-10-CM | POA: Diagnosis not present

## 2023-01-11 MED ORDER — NAPROXEN 250 MG PO TABS
375.0000 mg | ORAL_TABLET | Freq: Once | ORAL | Status: AC
  Administered 2023-01-11: 375 mg via ORAL
  Filled 2023-01-11: qty 2

## 2023-01-11 MED ORDER — NAPROXEN 375 MG PO TABS: 375.0000 mg | ORAL_TABLET | Freq: Two times a day (BID) | ORAL | 0 refills | Status: DC

## 2023-01-11 MED ORDER — PANTOPRAZOLE SODIUM 20 MG PO TBEC: 20.0000 mg | DELAYED_RELEASE_TABLET | Freq: Every day | ORAL | 0 refills | Status: AC

## 2023-01-11 MED ORDER — ACETAMINOPHEN 500 MG PO TABS
1000.0000 mg | ORAL_TABLET | Freq: Once | ORAL | Status: AC
  Administered 2023-01-11: 1000 mg via ORAL
  Filled 2023-01-11: qty 2

## 2023-01-11 NOTE — ED Triage Notes (Signed)
Pt reports she is here today due to left hand pain. Pt reports it started x2 days ago. Pt denies any injury/trauma to her hand. Pt reports it started swelling up. Pt reports feeling a "knot" around the swollen area. Pt denies any fevers. Pulses intact.

## 2023-01-11 NOTE — Discharge Instructions (Addendum)
Thank you for allowing [place] to take care of you today.  We hope you begin feeling better soon.  To-Do: Please follow-up with your primary doctor. In addition to the prescribed Naproxen, you may take 1000 mg of Tylenol every 8 hours as needed. Please return to the Emergency Department or call 911 if you experience chest pain, shortness of breath, severe pain, severe fever, altered mental status, or have any reason to think that you need emergency medical care.  Thank you again.  Hope you feel better soon.  Department of Emergency Medicine [facility]

## 2023-01-11 NOTE — ED Provider Notes (Signed)
[place] [facility] Provider Note  CSN: [id number] Arrival date & time: 01/11/23 6578  Chief Complaint(s) Hand Pain  HPI Mary Guerrero is a 52 y.o. female     Hand Pain This is a new problem. The current episode started 2 days ago. The problem occurs constantly. The problem has been gradually worsening. Pertinent negatives include no chest pain, no abdominal pain, no headaches and no shortness of breath. Exacerbated by: ROM and palpation. Relieved by: Immobility. She has tried nothing for the symptoms.   Patient awoke with pain one morning. No fall or trauma. Does not use needles. No prior h/o gout. No recent infections.  Past Medical History Past Medical History:  Diagnosis Date   Hypertension    Patient Active Problem List   Diagnosis Date Noted   Plantar fasciitis 05/29/2017   Home Medication(s) Prior to Admission medications   Medication Sig Start Date End Date Taking? Authorizing Provider  naproxen (NAPROSYN) 375 MG tablet Take 1 tablet (375 mg total) by mouth 2 (two) times daily. 01/11/23  Yes Garry Bochicchio, Amadeo Garnet, MD  pantoprazole (PROTONIX) 20 MG tablet Take 1 tablet (20 mg total) by mouth daily. 01/11/23  Yes Kailo Kosik, Amadeo Garnet, MD                                                                                                                                    Allergies Patient has no known allergies.  Review of Systems Review of Systems  Respiratory:  Negative for shortness of breath.   Cardiovascular:  Negative for chest pain.  Gastrointestinal:  Negative for abdominal pain.  Neurological:  Negative for headaches.   As noted in HPI  Physical Exam Vital Signs  I have reviewed the triage vital signs BP 133/85   Pulse 72   Temp 98 F (36.7 C) (Oral)   Resp 18   Ht 5\' 6"  (1.676 m)   Wt 95.3 kg   SpO2 97%   BMI 33.89 kg/m   Physical Exam Vitals reviewed.  Constitutional:      General: She is not in  acute distress.    Appearance: She is well-developed. She is not diaphoretic.  HENT:     Head: Normocephalic and atraumatic.     Right Ear: External ear normal.     Left Ear: External ear normal.     Nose: Nose normal.  Eyes:     General: No scleral icterus.    Conjunctiva/sclera: Conjunctivae normal.  Neck:     Trachea: Phonation normal.  Cardiovascular:     Rate and Rhythm: Normal rate and regular rhythm.  Pulmonary:     Effort: Pulmonary effort is normal. No respiratory distress.     Breath sounds: No stridor.  Abdominal:     General: There is no distension.  Musculoskeletal:        General: Normal range of motion.     Right  wrist: Normal pulse.     Left wrist: Swelling and tenderness present. No deformity, lacerations or crepitus. Normal pulse.     Cervical back: Normal range of motion.  Neurological:     Mental Status: She is alert and oriented to person, place, and time.  Psychiatric:        Behavior: Behavior normal.     ED Results and Treatments Labs (all labs ordered are listed, but only abnormal results are displayed) Labs Reviewed - No data to display                                                                                                                       EKG  EKG Interpretation Date/Time:    Ventricular Rate:    PR Interval:    QRS Duration:    QT Interval:    QTC Calculation:   R Axis:      Text Interpretation:         Radiology No results found.  Medications Ordered in ED Medications  naproxen (NAPROSYN) tablet 375 mg (has no administration in time range)  acetaminophen (TYLENOL) tablet 1,000 mg (has no administration in time range)   Procedures Procedures  (including critical care time) Medical Decision Making / ED Course   Medical Decision Making Risk OTC drugs. Prescription drug management.    DDX considered below  Left wrist pain and swelling. Doubt septic arthritis. Possible inflammatory arthritis (such gout or  pseudogout) or sprain. Possible overuse injury as well. No need for xray. Brace provided.  NSAID use recommended.     Final Clinical Impression(s) / ED Diagnoses Final diagnoses:  Left wrist pain   The patient appears reasonably screened and/or stabilized for discharge and I doubt any other medical condition or other [facility] requiring further screening, evaluation, or treatment in the ED at this time. I have discussed the findings, Dx and Tx plan with the patient/family who expressed understanding and agree(s) with the plan. Discharge instructions discussed at length. The patient/family was given strict return precautions who verbalized understanding of the instructions. No further questions at time of discharge.  Disposition: Discharge  Condition: Good  ED Discharge Orders          Ordered    naproxen (NAPROSYN) 375 MG tablet  2 times daily        01/11/23 0549    pantoprazole (PROTONIX) 20 MG tablet  Daily        01/11/23 0549             Follow Up: Aliene Beams, MD [phone] [address] [place] [address] [phone]  Call  to schedule an appointment for close follow up    This chart was dictated using voice recognition software.  Despite best efforts to proofread,  errors can occur which can change the documentation meaning.    Nira Conn, MD 01/11/23 [phone]

## 2023-03-21 ENCOUNTER — Ambulatory Visit: Admission: EM | Admit: 2023-03-21 | Discharge: 2023-03-21 | Disposition: A | Discharge status: Home / Self Care

## 2023-03-21 DIAGNOSIS — M25532 Pain in left wrist: Secondary | ICD-10-CM | POA: Diagnosis not present

## 2023-03-21 DIAGNOSIS — G5602 Carpal tunnel syndrome, left upper limb: Secondary | ICD-10-CM

## 2023-03-21 MED ORDER — TIZANIDINE HCL 4 MG PO TABS: 4.0000 mg | ORAL_TABLET | Freq: Two times a day (BID) | ORAL | 0 refills | Status: DC | PRN

## 2023-03-21 MED ORDER — PREDNISONE 20 MG PO TABS: 40.0000 mg | ORAL_TABLET | Freq: Every day | ORAL | 0 refills | Status: AC

## 2023-03-21 NOTE — ED Triage Notes (Addendum)
 Pt presents with what she describes as excruciating pain in left hand. Pain onset about 2 weeks ago. 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 Pt denies any falls or injuries and says the pain onset randomly one night while sleeping. Pain does radiates towards forearm.

## 2023-03-21 NOTE — ED Provider Notes (Signed)
 [facility]    CSN: [id number] Arrival date & time: 03/21/23  0801      History   Chief Complaint Chief Complaint  Patient presents with   Hand Pain    HPI Mary Guerrero is a 53 y.o. female.  Patient present today with a complaint of left hand pain that is radiating up into the left forearm.  Pain has been ongoing for 2 weeks.  Patient has not had injury.  Patient seen last December at [facility] ED with left wrist pain treated with anti-inflammatories. Current pain is normally improved with nonmovement of her thumb or wrist.  She also was prescribed a wrist splint during her visit at the emergency department which she wears occasionally at bedtime however she is unable to wear while she is working due to the brace and limits her mobility.  She was prescribed anti-inflammatories however reports this did not help at all.  She has not been seen by hand or a orthopedic specialist. Past Medical History:  Diagnosis Date   Hypertension     Patient Active Problem List   Diagnosis Date Noted   Plantar fasciitis 05/29/2017    Past Surgical History:  Procedure Laterality Date   ABDOMINAL HYSTERECTOMY  partial    OB History   No obstetric history on file.      Home Medications    Prior to Admission medications   Medication Sig Start Date End Date Taking? Authorizing Provider  amLODipine (NORVASC) 10 MG tablet Take 10 mg by mouth daily.   Yes [provider]  predniSONE (DELTASONE) 20 MG tablet Take 2 tablets (40 mg total) by mouth daily with breakfast for 5 days. 03/21/23 03/26/23 Yes Bing Neighbors, NP  tiZANidine (ZANAFLEX) 4 MG tablet Take 1 tablet (4 mg total) by mouth 3 times/day as needed-between meals & bedtime for muscle spasms. 03/21/23  Yes Bing Neighbors, NP  naproxen (NAPROSYN) 375 MG tablet Take 1 tablet (375 mg total) by mouth 2 (two) times daily. 01/11/23   Nira Conn, MD  pantoprazole (PROTONIX) 20 MG tablet Take 1 tablet (20 mg  total) by mouth daily. 01/11/23   Nira Conn, MD    Family History No family history on file.  Social History Social History   Tobacco Use   Smoking status: Never   Smokeless tobacco: Never  Substance Use Topics   Alcohol use: Yes    Comment: occassional   Drug use: No     Allergies   Patient has no known allergies.   Review of Systems Review of Systems Pertinent negatives listed in HPI   Physical Exam Triage Vital Signs ED Triage Vitals  Encounter Vitals Group     BP 03/21/23 0822 (!) 166/83     Systolic BP Percentile --      Diastolic BP Percentile --      Pulse Rate 03/21/23 0822 75     Resp 03/21/23 0822 18     Temp 03/21/23 0822 98.3 F (36.8 C)     Temp Source 03/21/23 0822 Oral     SpO2 03/21/23 0822 97 %     Weight --      Height --      Head Circumference --      Peak Flow --      Pain Score 03/21/23 0819 8     Pain Loc --      Pain Education --      Exclude from Growth Chart --  No data found.  Updated Vital Signs BP (!) 166/83 (BP Location: Right Arm) Comment: Pt takes meds to maintain; did not take this morning; dose scheduled for pm.  Pulse 75   Temp 98.3 F (36.8 C) (Oral)   Resp 18   SpO2 97%   Visual Acuity Right Eye Distance:   Left Eye Distance:   Bilateral Distance:    Right Eye Near:   Left Eye Near:    Bilateral Near:     Physical Exam Vitals reviewed.  Constitutional:      Appearance: Normal appearance.  HENT:     Head: Normocephalic and atraumatic.  Eyes:     Extraocular Movements: Extraocular movements intact.     Conjunctiva/sclera: Conjunctivae normal.     Pupils: Pupils are equal, round, and reactive to light.  Cardiovascular:     Rate and Rhythm: Normal rate and regular rhythm.  Pulmonary:     Effort: Pulmonary effort is normal.     Breath sounds: Normal breath sounds.  Musculoskeletal:     Left wrist: Swelling and tenderness present. Decreased range of motion.  Skin:    General: Skin is  warm and dry.     Capillary Refill: Capillary refill takes less than 2 seconds.  Neurological:     General: No focal deficit present.     Mental Status: She is alert and oriented to person, place, and time.      UC Treatments / Results  Labs (all labs ordered are listed, but only abnormal results are displayed) Labs Reviewed - No data to display  EKG   Radiology No results found.  Procedures Procedures (including critical care time)  Medications Ordered in UC Medications - No data to display  Initial Impression / Assessment and Plan / UC Course  I have reviewed the triage vital signs and the nursing notes.  Pertinent labs & imaging results that were available during my care of the patient were reviewed by me and considered in my medical decision making (see chart for details).     Carpal Tunnel of Left wrist, left wrist joint Treating with a burst of prednisone 40 mg daily for 5 days.  Tizanidine 4 mg at bedtime to alleviate nerve related pain.  Patient advised to follow-up at [facility] where she requires nerve conduction testing to completely evaluate the extent of what appears to be carpal tunnel syndrome involving the left wrist.  Encouraged patient to continue wearing wrist splint  at bedtime likely to facilitate improvement of carpal tunnel related pain symptoms.  Patient verbalized understanding and agreement with plan. 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  Tizanidine take prior to going to sleep this will help relax the nerves in your wrist and help improve symptoms of pain.  Continue to wear your wrist splint at nighttime or at bedtime this helps facilitate good blood flow and compresses the inflammation in the nerve that is causing to  experience the pain in your wrist.  [facility] to schedule follow-up evaluation with a hand specialist as she will need nerve conduction testing in order to prescribed the appropriate treatment to resolve your symptoms.     ED Prescriptions     Medication Sig Dispense Auth. Provider  predniSONE (DELTASONE) 20 MG tablet Take 2 tablets (40 mg total) by mouth daily with breakfast for 5 days. 10 tablet Bing Neighbors, NP   tiZANidine (ZANAFLEX) 4 MG tablet Take 1 tablet (4 mg total) by mouth 3 times/day as needed-between meals & bedtime for muscle spasms. 30 tablet Bing Neighbors, NP      PDMP not reviewed this encounter.   Bing Neighbors, NP 03/21/23 [phone]

## 2023-03-21 NOTE — Discharge Instructions (Signed)
 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  Tizanidine take prior to going to sleep this will help relax the nerves in your wrist and help improve symptoms of pain.  Continue to wear your wrist splint at nighttime or at bedtime this helps facilitate good blood flow and compresses the inflammation in the nerve that is causing to experience the pain in your wrist.  [facility] to schedule follow-up evaluation with a hand specialist as she will need nerve conduction testing in order to prescribed the appropriate treatment to resolve your symptoms.

## 2023-04-07 ENCOUNTER — Telehealth: Payer: Self-pay

## 2023-04-07 ENCOUNTER — Other Ambulatory Visit (HOSPITAL_COMMUNITY): Payer: Self-pay

## 2023-04-07 ENCOUNTER — Encounter: Payer: Self-pay | Admitting: Emergency Medicine

## 2023-04-07 ENCOUNTER — Ambulatory Visit: Payer: 59 | Admitting: Emergency Medicine

## 2023-04-07 VITALS — BP 124/82 | HR 82 | Temp 98.6°F | Ht 66.0 in | Wt 213.0 lb

## 2023-04-07 DIAGNOSIS — E66811 Obesity, class 1: Secondary | ICD-10-CM | POA: Diagnosis not present

## 2023-04-07 DIAGNOSIS — I10 Essential (primary) hypertension: Secondary | ICD-10-CM

## 2023-04-07 DIAGNOSIS — E6609 Other obesity due to excess calories: Secondary | ICD-10-CM | POA: Diagnosis not present

## 2023-04-07 DIAGNOSIS — Z7689 Persons encountering health services in other specified circumstances: Secondary | ICD-10-CM

## 2023-04-07 DIAGNOSIS — Z1211 Encounter for screening for malignant neoplasm of colon: Secondary | ICD-10-CM

## 2023-04-07 DIAGNOSIS — Z6834 Body mass index (BMI) 34.0-34.9, adult: Secondary | ICD-10-CM | POA: Diagnosis not present

## 2023-04-07 LAB — COMPREHENSIVE METABOLIC PANEL
ALT: 22 U/L (ref 0–35)
AST: 21 U/L (ref 0–37)
Albumin: 4.5 g/dL (ref 3.5–5.2)
Alkaline Phosphatase: 106 U/L (ref 39–117)
BUN: 14 mg/dL (ref 6–23)
CO2: 30 meq/L (ref 19–32)
Calcium: 9.5 mg/dL (ref 8.4–10.5)
Chloride: 105 meq/L (ref 96–112)
Creatinine, Ser: 0.85 mg/dL (ref 0.40–1.20)
GFR: 79.5 mL/min (ref >60.00)
Glucose, Bld: 108 mg/dL — ABNORMAL HIGH (ref 70–99)
Potassium: 3.7 meq/L (ref 3.5–5.1)
Sodium: 142 meq/L (ref 135–145)
Total Bilirubin: 0.4 mg/dL (ref 0.2–1.2)
Total Protein: 7.1 g/dL (ref 6.0–8.3)

## 2023-04-07 LAB — CBC WITH DIFFERENTIAL/PLATELET
Basophils Absolute: 0 10*3/uL (ref 0.0–0.1)
Basophils Relative: 0.8 % (ref 0.0–3.0)
Eosinophils Absolute: 0.3 10*3/uL (ref 0.0–0.7)
Eosinophils Relative: 4.3 % (ref 0.0–5.0)
HCT: 42.2 % (ref 36.0–46.0)
Hemoglobin: 13.7 g/dL (ref 12.0–15.0)
Lymphocytes Relative: 28.6 % (ref 12.0–46.0)
Lymphs Abs: 1.7 10*3/uL (ref 0.7–4.0)
MCHC: 32.4 g/dL (ref 30.0–36.0)
MCV: 77.7 fl — ABNORMAL LOW (ref 78.0–100.0)
Monocytes Absolute: 0.4 10*3/uL (ref 0.1–1.0)
Monocytes Relative: 7.3 % (ref 3.0–12.0)
Neutro Abs: 3.4 10*3/uL (ref 1.4–7.7)
Neutrophils Relative %: 59 % (ref 43.0–77.0)
Platelets: 291 10*3/uL (ref 150.0–400.0)
RBC: 5.43 Mil/uL — ABNORMAL HIGH (ref 3.87–5.11)
RDW: 14.7 % (ref 11.5–15.5)
WBC: 5.8 10*3/uL (ref 4.0–10.5)

## 2023-04-07 LAB — LIPID PANEL
Cholesterol: 190 mg/dL (ref 0–200)
HDL: 72.4 mg/dL (ref >39.00)
LDL Cholesterol: 107 mg/dL — ABNORMAL HIGH (ref 0–99)
NonHDL: 117.57
Total CHOL/HDL Ratio: 3
Triglycerides: 52 mg/dL (ref 0.0–149.0)
VLDL: 10.4 mg/dL (ref 0.0–40.0)

## 2023-04-07 LAB — TSH: TSH: 1.29 u[IU]/mL (ref 0.35–5.50)

## 2023-04-07 LAB — HEMOGLOBIN A1C: Hgb A1c MFr Bld: 6.2 % (ref 4.6–6.5)

## 2023-04-07 MED ORDER — WEGOVY 0.25 MG/0.5ML ~~LOC~~ SOAJ: 0.2500 mg | SUBCUTANEOUS | 3 refills | Status: DC

## 2023-04-07 NOTE — Patient Instructions (Signed)

## 2023-04-07 NOTE — Assessment & Plan Note (Signed)
 BP Readings from Last 3 Encounters:  04/07/23 124/82  03/21/23 (!) 166/83  01/11/23 133/85  Well-controlled hypertension Cardiovascular risk associated with hypertension discussed Diet and nutrition discussed Continue amlodipine 10 mg daily and metoprolol succinate 25 mg daily Blood work done today Follow-up in 3 months

## 2023-04-07 NOTE — Telephone Encounter (Signed)
 Pharmacy Patient Advocate Encounter   Received notification from CoverMyMeds that prior authorization for [facility] 0.25MG /0.5ML is required/requested.   Insurance verification completed.   The patient is insured through [employer] .   Per test claim: PA required; PA submitted to above mentioned insurance via CoverMyMeds Key/confirmation #/EOC (Key: [id number])  Status is pending

## 2023-04-07 NOTE — Assessment & Plan Note (Signed)
 Cardiovascular risks associated with obesity discussed Diet and nutrition discussed.  Advised to decrease amount of daily carbohydrate intake and daily calories and increase amount of plant based protein in her diet Will benefit from GLP-1 agonist as per insurance's formulary Prescription for [facility] sent to pharmacy of record today

## 2023-04-07 NOTE — Progress Notes (Signed)
 Mary Guerrero 53 y.o.   Chief Complaint  Patient presents with   Establish Care    Patient here just establish care. She was going to [facility] medical seeing Dr. Yetta Barre. She wants everything back up to date with her bp medications.      HISTORY OF PRESENT ILLNESS: This is a 53 y.o. female first visit to this office, here to establish care with me. Has history of hypertension on amlodipine and metoprolol succinate Interested in losing weight No other complaints or medical concerns today.  HPI   Prior to Admission medications   Medication Sig Start Date End Date Taking? Authorizing Provider  amLODipine (NORVASC) 10 MG tablet Take 10 mg by mouth daily.   Yes [provider]  metoprolol succinate (TOPROL-XL) 25 MG 24 hr tablet Take 1 tablet by mouth daily. 02/08/23  Yes [provider]  Semaglutide-Weight Management (WEGOVY) 0.25 MG/0.5ML SOAJ Inject 0.25 mg into the skin once a week. 04/07/23  Yes Lyrah Bradt, Eilleen Kempf, MD  naproxen (NAPROSYN) 375 MG tablet Take 1 tablet (375 mg total) by mouth 2 (two) times daily. Patient not taking: Reported on 04/07/2023 01/11/23   Nira Conn, MD  pantoprazole (PROTONIX) 20 MG tablet Take 1 tablet (20 mg total) by mouth daily. Patient not taking: Reported on 04/07/2023 01/11/23   Nira Conn, MD  tiZANidine (ZANAFLEX) 4 MG tablet Take 1 tablet (4 mg total) by mouth 3 times/day as needed-between meals & bedtime for muscle spasms. Patient not taking: Reported on 04/07/2023 03/21/23   Bing Neighbors, NP    No Known Allergies  Patient Active Problem List   Diagnosis Date Noted   Essential hypertension 04/07/2023   Class 1 obesity due to excess calories with serious comorbidity and body mass index (BMI) of 34.0 to 34.9 in adult 04/07/2023   Plantar fasciitis 05/29/2017    Past Medical History:  Diagnosis Date   Hypertension     Past Surgical History:  Procedure Laterality Date   ABDOMINAL HYSTERECTOMY   partial    Social History   Socioeconomic History   Marital status: Single    Spouse name: Not on file   Number of children: 2   Years of education: Not on file   Highest education level: Some college, no degree  Occupational History   Not on file  Tobacco Use   Smoking status: Never   Smokeless tobacco: Never  Vaping Use   Vaping status: Never Used  Substance and Sexual Activity   Alcohol use: Yes    Alcohol/week: 2.0 standard drinks of alcohol    Types: 2 Glasses of wine per week    Comment: occassional   Drug use: No   Sexual activity: Not Currently  Other Topics Concern   Not on file  Social History Narrative   Not on file   Social Drivers of Health   Financial Resource Strain: Medium Risk (02/03/2022)   Received from [facility]   Overall Financial Resource Strain (CARDIA)    Difficulty of Paying Living Expenses: Somewhat hard  Food Insecurity: Food Insecurity Present (02/03/2022)   Received from [facility]   Hunger Vital Sign    Worried About Running Out of Food in the Last Year: Not on file    Ran Out of Food in the Last Year: Sometimes true  Transportation Needs: No Transportation Needs (02/03/2022)   Received from [facility] - Transportation    Lack of Transportation (Medical): No  Lack of Transportation (Non-Medical): No  Physical Activity: Not on file  Stress: Not on file  Social Connections: Not on file  Intimate Partner Violence: Not on file    Family History  Problem Relation Age of Onset   Ovarian cancer Mother      Review of Systems  Constitutional: Negative.  Negative for chills and fever.  HENT: Negative.  Negative for congestion and sore throat.   Respiratory: Negative.  Negative for cough and shortness of breath.   Cardiovascular: Negative.  Negative for chest pain and palpitations.  Gastrointestinal:  Negative for abdominal pain, diarrhea, nausea and vomiting.  Genitourinary: Negative.  Negative for dysuria  and hematuria.  Skin: Negative.  Negative for rash.  Neurological: Negative.  Negative for dizziness and headaches.  All other systems reviewed and are negative.   Vitals:   04/07/23 1501  BP: 124/82  Pulse: 82  Temp: 98.6 F (37 C)  SpO2: 96%    Physical Exam Vitals reviewed.  Constitutional:      Appearance: Normal appearance. She is obese.  HENT:     Head: Normocephalic.     Right Ear: Tympanic membrane, ear canal and external ear normal.     Left Ear: Tympanic membrane, ear canal and external ear normal.     Mouth/Throat:     Mouth: Mucous membranes are moist.     Pharynx: Oropharynx is clear.  Eyes:     Extraocular Movements: Extraocular movements intact.     Conjunctiva/sclera: Conjunctivae normal.     Pupils: Pupils are equal, round, and reactive to light.  Cardiovascular:     Rate and Rhythm: Normal rate and regular rhythm.     Pulses: Normal pulses.     Heart sounds: Normal heart sounds.  Pulmonary:     Effort: Pulmonary effort is normal.     Breath sounds: Normal breath sounds.  Abdominal:     Palpations: Abdomen is soft.     Tenderness: There is no abdominal tenderness.  Musculoskeletal:     Cervical back: No tenderness.  Lymphadenopathy:     Cervical: No cervical adenopathy.  Skin:    General: Skin is warm and dry.     Capillary Refill: Capillary refill takes less than 2 seconds.  Neurological:     General: No focal deficit present.     Mental Status: She is alert and oriented to person, place, and time.  Psychiatric:        Mood and Affect: Mood normal.        Behavior: Behavior normal.      ASSESSMENT & PLAN: A total of 48 minutes was spent with the patient and counseling/coordination of care regarding preparing for this visit, review of available medical records, establishing care with me, review of chronic medical conditions under management, cardiovascular risks associated with hypertension and obesity, review of all medications and changes  made, education on nutrition, review of health maintenance items, mental health, review of available most recent blood work results, prognosis, documentation and need for follow-up.  Problem List Items Addressed This Visit       Cardiovascular and Mediastinum   Essential hypertension - Primary   BP Readings from Last 3 Encounters:  04/07/23 124/82  03/21/23 (!) 166/83  01/11/23 133/85  Well-controlled hypertension Cardiovascular risk associated with hypertension discussed Diet and nutrition discussed Continue amlodipine 10 mg daily and metoprolol succinate 25 mg daily Blood work done today Follow-up in 3 months      Relevant Medications   metoprolol  succinate (TOPROL-XL) 25 MG 24 hr tablet   Other Relevant Orders   TSH   CBC with Differential/Platelet   Comprehensive metabolic panel   Hemoglobin A1c   Lipid panel     Other   Class 1 obesity due to excess calories with serious comorbidity and body mass index (BMI) of 34.0 to 34.9 in adult   Cardiovascular risks associated with obesity discussed Diet and nutrition discussed.  Advised to decrease amount of daily carbohydrate intake and daily calories and increase amount of plant based protein in her diet Will benefit from GLP-1 agonist as per insurance's formulary Prescription for [facility] sent to pharmacy of record today      Relevant Medications   Semaglutide-Weight Management (WEGOVY) 0.25 MG/0.5ML SOAJ   Other Relevant Orders   TSH   CBC with Differential/Platelet   Comprehensive metabolic panel   Hemoglobin A1c   Lipid panel   Other Visit Diagnoses       Encounter to establish care       Relevant Orders   TSH   CBC with Differential/Platelet   Comprehensive metabolic panel   Hemoglobin A1c   Lipid panel     Screening for colon cancer       Relevant Orders   Cologuard        Patient Instructions  Health Maintenance, Female Adopting a healthy lifestyle and getting preventive care are important in  promoting health and wellness. Ask your health care provider about: The right schedule for you to have regular tests and exams. Things you can do on your own to prevent diseases and keep yourself healthy. What should I know about diet, weight, and exercise? Eat a healthy diet  Eat a diet that includes plenty of vegetables, fruits, low-fat dairy products, and lean protein. Do not eat a lot of foods that are high in solid fats, added sugars, or sodium. Maintain a healthy weight Body mass index (BMI) is used to identify weight problems. It estimates body fat based on height and weight. Your health care provider can help determine your BMI and help you achieve or maintain a healthy weight. Get regular exercise Get regular exercise. This is one of the most important things you can do for your health. Most adults should: Exercise for at least 150 minutes each week. The exercise should increase your heart rate and make you sweat (moderate-intensity exercise). Do strengthening exercises at least twice a week. This is in addition to the moderate-intensity exercise. Spend less time sitting. Even light physical activity can be beneficial. Watch cholesterol and blood lipids Have your blood tested for lipids and cholesterol at 51 years of age, then have this test every 5 years. Have your cholesterol levels checked more often if: Your lipid or cholesterol levels are high. You are older than 51 years of age. You are at high risk for heart disease. What should I know about cancer screening? Depending on your health history and family history, you may need to have cancer screening at various ages. This may include screening for: Breast cancer. Cervical cancer. Colorectal cancer. Skin cancer. Lung cancer. What should I know about heart disease, diabetes, and high blood pressure? Blood pressure and heart disease High blood pressure causes heart disease and increases the risk of stroke. This is more likely  to develop in people who have high blood pressure readings or are overweight. Have your blood pressure checked: Every 3-5 years if you are 23-48 years of age. Every year if you are 40 years  old or older. Diabetes Have regular diabetes screenings. This checks your fasting blood sugar level. Have the screening done: Once every three years after age 42 if you are at a normal weight and have a low risk for diabetes. More often and at a younger age if you are overweight or have a high risk for diabetes. What should I know about preventing infection? Hepatitis B If you have a higher risk for hepatitis B, you should be screened for this virus. Talk with your health care provider to find out if you are at risk for hepatitis B infection. Hepatitis C Testing is recommended for: Everyone born from [birth year] through 1963. Anyone with known risk factors for hepatitis C. Sexually transmitted infections (STIs) Get screened for STIs, including gonorrhea and chlamydia, if: You are sexually active and are younger than 50 years of age. You are older than 51 years of age and your health care provider tells you that you are at risk for this type of infection. Your sexual activity has changed since you were last screened, and you are at increased risk for chlamydia or gonorrhea. Ask your health care provider if you are at risk. Ask your health care provider about whether you are at high risk for HIV. Your health care provider may recommend a prescription medicine to help prevent HIV infection. If you choose to take medicine to prevent HIV, you should first get tested for HIV. You should then be tested every 3 months for as long as you are taking the medicine. Pregnancy If you are about to stop having your period (premenopausal) and you may become pregnant, seek counseling before you get pregnant. Take 400 to 800 micrograms (mcg) of folic acid every day if you become pregnant. Ask for birth control (contraception) if  you want to prevent pregnancy. Osteoporosis and menopause Osteoporosis is a disease in which the bones lose minerals and strength with aging. This can result in bone fractures. If you are 90 years old or older, or if you are at risk for osteoporosis and fractures, ask your health care provider if you should: Be screened for bone loss. Take a calcium or vitamin D supplement to lower your risk of fractures. Be given hormone replacement therapy (HRT) to treat symptoms of menopause. Follow these instructions at home: Alcohol use Do not drink alcohol if: Your health care provider tells you not to drink. You are pregnant, may be pregnant, or are planning to become pregnant. If you drink alcohol: Limit how much you have to: 0-1 drink a day. Know how much alcohol is in your drink. In the [place] one drink equals one 12 oz bottle of beer (355 mL), one 5 oz glass of wine (148 mL), or one 1 oz glass of hard liquor (44 mL). Lifestyle Do not use any products that contain nicotine or tobacco. These products include cigarettes, chewing tobacco, and vaping devices, such as e-cigarettes. If you need help quitting, ask your health care provider. Do not use street drugs. Do not share needles. Ask your health care provider for help if you need support or information about quitting drugs. General instructions Schedule regular health, dental, and eye exams. Stay current with your vaccines. Tell your health care provider if: You often feel depressed. You have ever been abused or do not feel safe at home. Summary Adopting a healthy lifestyle and getting preventive care are important in promoting health and wellness. Follow your health care provider's instructions about healthy diet, exercising, and getting tested or  screened for diseases. Follow your health care provider's instructions on monitoring your cholesterol and blood pressure. This information is not intended to replace advice given to you by your  health care provider. Make sure you discuss any questions you have with your health care provider. Document Revised: 05/26/2020 Document Reviewed: 05/26/2020 Elsevier Patient Education  2024 Elsevier Inc.    Edwina Barth, MD [facility]

## 2023-04-08 NOTE — Telephone Encounter (Signed)
 Pharmacy Patient Advocate Encounter  Received notification from [facility] that Prior Authorization for [facility] 0.25MG /0.5ML  has been DENIED.  Full denial letter will be uploaded to the media tab. See denial reason below.   PA #/Case ID/Reference #: [id number]

## 2023-04-08 NOTE — Telephone Encounter (Signed)
 Copied from CRM [id number]. Topic: General - Other >> Apr 08, 2023  2:30 PM Fredrich Romans wrote: Reason for CRM: Patient called in to ask about wegovy being approved through her insurance >> Apr 08, 2023  2:34 PM Denman George wrote: Pt's prior Berkley Harvey has been started. We are waiting on ins response.

## 2023-04-09 ENCOUNTER — Encounter: Payer: Self-pay | Admitting: Emergency Medicine

## 2023-04-11 ENCOUNTER — Encounter: Payer: Self-pay | Admitting: Radiology

## 2023-04-19 NOTE — Telephone Encounter (Signed)
 Copied from CRM [id number]. Topic: Clinical - Prescription Issue >> Apr 18, 2023  4:47 PM Mary Guerrero wrote: Reason for CRM: Pt called stated insurance denied Semaglutide-Weight Management (WEGOVY) 0.25 MG/0.5ML SOAJ, insurance need more information regarding why pt needs medication. Please call pt at [phone].

## 2023-04-25 ENCOUNTER — Ambulatory Visit: Payer: Self-pay | Admitting: Surgery

## 2023-04-25 LAB — COLOGUARD: COLOGUARD: NEGATIVE

## 2023-05-03 NOTE — Telephone Encounter (Signed)
 Please start PA for pt's wegovy RX

## 2023-05-04 ENCOUNTER — Other Ambulatory Visit: Payer: Self-pay

## 2023-05-04 ENCOUNTER — Emergency Department (HOSPITAL_COMMUNITY)
Admission: EM | Admit: 2023-05-04 | Discharge: 2023-05-04 | Disposition: A | Discharge status: Home / Self Care | Attending: Emergency Medicine | Admitting: Emergency Medicine

## 2023-05-04 DIAGNOSIS — I10 Essential (primary) hypertension: Secondary | ICD-10-CM | POA: Diagnosis not present

## 2023-05-04 DIAGNOSIS — Z79899 Other long term (current) drug therapy: Secondary | ICD-10-CM | POA: Diagnosis not present

## 2023-05-04 DIAGNOSIS — M25562 Pain in left knee: Secondary | ICD-10-CM | POA: Diagnosis present

## 2023-05-04 MED ORDER — PREDNISONE 10 MG (21) PO TBPK: ORAL_TABLET | Freq: Every day | ORAL | 0 refills | Status: DC

## 2023-05-04 MED ORDER — KETOROLAC TROMETHAMINE 15 MG/ML IJ SOLN
15.0000 mg | Freq: Once | INTRAMUSCULAR | Status: AC
  Administered 2023-05-04: 15 mg via INTRAMUSCULAR
  Filled 2023-05-04: qty 1

## 2023-05-04 NOTE — Discharge Instructions (Addendum)
 You were evaluated in the emergency room for left knee pain. A prescription for prednisone was sent into your pharmacy.  You additionally provided a referral to see orthopedics.  You may alternate Tylenol and ibuprofen as needed for pain.  Would additionally recommend applying ice.  Should you experience any new or worsening symptoms including worsening pain, swelling, redness, warmth fevers and chills please return to emergency room.

## 2023-05-04 NOTE — ED Triage Notes (Signed)
 Pt presents with left knee pain. Pain started this morning upon wakening. Pt unable to bear weight on the knee. No injury to the knee prior. Ambulates frequently on the job.

## 2023-05-04 NOTE — ED Provider Notes (Signed)
 [place] EMERGENCY DEPARTMENT AT [facility] Provider Note   CSN: [id number] Arrival date & time: 05/04/23  1902     History  Chief Complaint  Patient presents with   Knee Pain    Mary Guerrero is a 53 y.o. female with history of hypertension presents with complaints of left knee pain.  Patient denies any injury or trauma.  Denies any numbness or tingling.  Pain is worse with ambulation.  No prior surgeries to the affected extremity.  No history of gout.  No IV drug use.   Knee Pain     Past Medical History:  Diagnosis Date   Hypertension      Home Medications Prior to Admission medications   Medication Sig Start Date End Date Taking? Authorizing Provider  predniSONE  (STERAPRED UNI-PAK 21 TAB) 10 MG (21) TBPK tablet Take by mouth daily. Take 6 tabs by mouth daily  for 2 days, then 5 tabs for 2 days, then 4 tabs for 2 days, then 3 tabs for 2 days, 2 tabs for 2 days, then 1 tab by mouth daily for 2 days 05/04/23  Yes Felicie Horning, PA-C  amLODipine (NORVASC) 10 MG tablet Take 10 mg by mouth daily.    [provider]  metoprolol succinate (TOPROL-XL) 25 MG 24 hr tablet Take 1 tablet by mouth daily. 02/08/23   [provider]  naproxen  (NAPROSYN ) 375 MG tablet Take 1 tablet (375 mg total) by mouth 2 (two) times daily. Patient not taking: Reported on 04/07/2023 01/11/23   Lindle Rhea, MD  pantoprazole  (PROTONIX ) 20 MG tablet Take 1 tablet (20 mg total) by mouth daily. Patient not taking: Reported on 04/07/2023 01/11/23   Lindle Rhea, MD  Semaglutide -Weight Management (WEGOVY ) 0.25 MG/0.5ML SOAJ Inject 0.25 mg into the skin once a week. 04/07/23   Elvira Hammersmith, MD  tiZANidine  (ZANAFLEX ) 4 MG tablet Take 1 tablet (4 mg total) by mouth 3 times/day as needed-between meals & bedtime for muscle spasms. Patient not taking: Reported on 04/07/2023 03/21/23   Buena Carmine, NP      Allergies    Patient has no known  allergies.    Review of Systems   Review of Systems  Musculoskeletal:  Positive for myalgias.    Physical Exam Updated Vital Signs BP (!) 149/90 (BP Location: Left Arm)   Pulse 80   Temp 98.2 F (36.8 C) (Oral)   Resp 15   Ht 5\' 5"  (1.651 m)   Wt 97.5 kg   SpO2 100%   BMI 35.78 kg/m  Physical Exam Vitals and nursing note reviewed.  Constitutional:      General: She is not in acute distress.    Appearance: She is well-developed.  HENT:     Head: Normocephalic and atraumatic.  Eyes:     Conjunctiva/sclera: Conjunctivae normal.  Cardiovascular:     Pulses: Normal pulses.  Pulmonary:     Effort: Pulmonary effort is normal. No respiratory distress.  Musculoskeletal:        General: No swelling.     Cervical back: Neck supple.     Comments: Left knee without significant focal tenderness, no significant swelling, no overlying erythema or warmth, tolerates full range of motion with some discomfort at end ranges of flexion, DP pulses symmetric, negative Homans, full ankle range of motion, is able to ambulate   Skin:    General: Skin is warm and dry.     Capillary Refill: Capillary refill takes less than  2 seconds.  Neurological:     Mental Status: She is alert.  Psychiatric:        Mood and Affect: Mood normal.     ED Results / Procedures / Treatments   Labs (all labs ordered are listed, but only abnormal results are displayed) Labs Reviewed - No data to display  EKG None  Radiology No results found.  Procedures Procedures    Medications Ordered in ED Medications  ketorolac  (TORADOL ) 15 MG/ML injection 15 mg (has no administration in time range)    ED Course/ Medical Decision Making/ A&P                                 Medical Decision Making  This patient presents to the ED with chief complaint(s) of knee pain.  The complaint involves an extensive differential diagnosis and also carries with it a high risk of complications and morbidity.   pertinent  past medical history as listed in HPI  The differential diagnosis includes  Septic joint, gout, fracture, dislocation, sprain, musculoskeletal  Additional history obtained: Records reviewed Care Everywhere/External Records  Initial Assessment:   Patient presents with atraumatic left knee pain that started earlier today.  No prior surgeries or injuries to the knee.  No history of gout or IV drug use.  Exam she has no overlying erythema or warmth of the knee, she tolerates full range of motion of the knee with some discomfort at end ranges of flexion.  No suspicion for septic joint or gout.  There is no injury or trauma to report any concern for fracture dislocation.  Negative Homans, pulses are symmetric.  No concern for vascular injury or DVT.  Patient does have some posterior knee discomfort.  Overall most concerning for musculoskeletal etiology such as Baker's cyst.  Provided Ortho follow-up.  Offered brace, patient declines.  Will give a shot of Toradol  and send in prescription for prednisone  taper.  Using shared decision making, decided to defer any imaging at this time.   Independent ECG interpretation:  none  Independent labs interpretation:  The following labs were independently interpreted:  none  Independent visualization and interpretation of imaging: none  Treatment and Reassessment: Patient given shot of Toradol  during visit  Consultations obtained:   none  Disposition:   Patient be discharged home.  Provided follow-up with Ortho. The patient has been appropriately medically screened and/or stabilized in the ED. I have low suspicion for any other emergent medical condition which would require further screening, evaluation or treatment in the ED or require inpatient management. At time of discharge the patient is hemodynamically stable and in no acute distress. I have discussed work-up results and diagnosis with patient and answered all questions. Patient is agreeable with  discharge plan. We discussed strict return precautions for returning to the emergency department and they verbalized understanding.     Social Determinants of Health:   none  This note was dictated with voice recognition software.  Despite best efforts at proofreading, errors may have occurred which can change the documentation meaning.          Final Clinical Impression(s) / ED Diagnoses Final diagnoses:  Acute pain of left knee    Rx / DC Orders ED Discharge Orders          Ordered    predniSONE  (STERAPRED UNI-PAK 21 TAB) 10 MG (21) TBPK tablet  Daily  05/04/23 2141              Felicie Horning, PA-C 05/04/23 2142    Sallyanne Creamer, DO 05/08/23 1334

## 2023-05-12 ENCOUNTER — Ambulatory Visit
Admission: EM | Admit: 2023-05-12 | Discharge: 2023-05-12 | Disposition: A | Discharge status: Home / Self Care | Attending: Family Medicine | Admitting: Family Medicine

## 2023-05-12 ENCOUNTER — Other Ambulatory Visit: Payer: Self-pay

## 2023-05-12 DIAGNOSIS — R739 Hyperglycemia, unspecified: Secondary | ICD-10-CM | POA: Diagnosis not present

## 2023-05-12 DIAGNOSIS — H538 Other visual disturbances: Secondary | ICD-10-CM

## 2023-05-12 LAB — POCT FASTING CBG KUC MANUAL ENTRY: POCT Glucose (KUC): 261 mg/dL — AB (ref 70–99)

## 2023-05-12 NOTE — ED Triage Notes (Signed)
 Pt c/o blurred vision, pt's states feels hot and feels like her face is swelling up. Pt states this started an hour or 2 ago. Pt is diaphoretic.

## 2023-05-12 NOTE — ED Provider Notes (Signed)
 [facility]    CSN: [id number] Arrival date & time: 05/12/23  1942      History   Chief Complaint No chief complaint on file.   HPI Guerrero Guerrero is a 53 y.o. female.   HPI Here for blurry vision.  It started about 2 hours ago while it was still going on she had the feeling that her face was swelling or at least feeling tight.  She also got diaphoretic during that time.  No headache or chest pain or shortness of breath.  The feeling that her face is tight is gone.  The blurry vision is improved.  She felt very hot at first when this was happening.  NKDA  No noted history of diabetes.  She does have a history of hypertension  Past surgical history is significant for hysterectomy Past Medical History:  Diagnosis Date   Hypertension     Patient Active Problem List   Diagnosis Date Noted   Essential hypertension 04/07/2023   Class 1 obesity due to excess calories with serious comorbidity and body mass index (BMI) of 34.0 to 34.9 in adult 04/07/2023   Plantar fasciitis 05/29/2017    Past Surgical History:  Procedure Laterality Date   ABDOMINAL HYSTERECTOMY  partial    OB History   No obstetric history on file.      Home Medications    Prior to Admission medications   Medication Sig Start Date End Date Taking? Authorizing Provider  amLODipine (NORVASC) 10 MG tablet Take 10 mg by mouth daily.    [provider]  metoprolol succinate (TOPROL-XL) 25 MG 24 hr tablet Take 1 tablet by mouth daily. 02/08/23   [provider]  pantoprazole  (PROTONIX ) 20 MG tablet Take 1 tablet (20 mg total) by mouth daily. Patient not taking: Reported on 04/07/2023 01/11/23   Lindle Rhea, MD  Semaglutide -Weight Management (WEGOVY ) 0.25 MG/0.5ML SOAJ Inject 0.25 mg into the skin once a week. 04/07/23   Elvira Hammersmith, MD    Family History Family History  Problem Relation Age of Onset   Ovarian cancer Mother     Social History Social  History   Tobacco Use   Smoking status: Never   Smokeless tobacco: Never  Vaping Use   Vaping status: Never Used  Substance Use Topics   Alcohol use: Yes    Alcohol/week: 2.0 standard drinks of alcohol    Types: 2 Glasses of wine per week    Comment: occassional   Drug use: No     Allergies   Patient has no known allergies.   Review of Systems Review of Systems   Physical Exam Triage Vital Signs ED Triage Vitals  Encounter Vitals Group     BP 05/12/23 1954 134/78     Systolic BP Percentile --      Diastolic BP Percentile --      Pulse Rate 05/12/23 1954 97     Resp 05/12/23 1954 18     Temp 05/12/23 1954 98 F (36.7 C)     Temp Source 05/12/23 1954 Oral     SpO2 05/12/23 1954 93 %     Weight --      Height --      Head Circumference --      Peak Flow --      Pain Score 05/12/23 1952 0     Pain Loc --      Pain Education --      Exclude from Growth Chart --  No data found.  Updated Vital Signs BP 134/78   Pulse 97   Temp 98 F (36.7 C) (Oral)   Resp 18   SpO2 93%   Visual Acuity Right Eye Distance:   Left Eye Distance:   Bilateral Distance:    Right Eye Near:   Left Eye Near:    Bilateral Near:     Physical Exam Vitals reviewed.  Constitutional:      General: She is not in acute distress.    Appearance: She is not toxic-appearing.  HENT:     Nose: Nose normal.     Mouth/Throat:     Mouth: Mucous membranes are moist.     Pharynx: No oropharyngeal exudate or posterior oropharyngeal erythema.  Eyes:     Extraocular Movements: Extraocular movements intact.     Conjunctiva/sclera: Conjunctivae normal.     Pupils: Pupils are equal, round, and reactive to light.  Cardiovascular:     Rate and Rhythm: Normal rate and regular rhythm.     Heart sounds: No murmur heard. Pulmonary:     Effort: Pulmonary effort is normal. No respiratory distress.     Breath sounds: No stridor. No wheezing, rhonchi or rales.  Musculoskeletal:     Cervical back:  Neck supple.  Lymphadenopathy:     Cervical: No cervical adenopathy.  Skin:    Capillary Refill: Capillary refill takes less than 2 seconds.     Coloration: Skin is not jaundiced or pale.  Neurological:     General: No focal deficit present.     Mental Status: She is alert and oriented to person, place, and time.  Psychiatric:        Behavior: Behavior normal.      UC Treatments / Results  Labs (all labs ordered are listed, but only abnormal results are displayed) Labs Reviewed  POCT FASTING CBG [facility] MANUAL ENTRY - Abnormal; Notable for the following components:      Result Value   POCT Glucose ([facility]) 261 (*)    All other components within normal limits  CBC  BASIC METABOLIC PANEL WITH GFR    EKG   Radiology No results found.  Procedures Procedures (including critical care time)  Medications Ordered in UC Medications - No data to display  Initial Impression / Assessment and Plan / UC Course  I have reviewed the triage vital signs and the nursing notes.  Pertinent labs & imaging results that were available during my care of the patient were reviewed by me and considered in my medical decision making (see chart for details).     Her sugar is 261 anything significantly abnormal  on fingerstick.  CBC and BMP are drawn.  Staff will notify her if there is anything significantly abnormal.  She is taking prednisone  and that is most likely why her sugars are elevated.  She had eaten about 30 minutes prior to arriving here.  She has not had elevated sugars in the past.  She will follow-up with primary care.  I discussed with her a diet that can help her sugar be improved.  If she worsens anyway she will go to the emergency room   Final Clinical Impressions(s) / UC Diagnoses   Final diagnoses:  Blurry vision  Hyperglycemia     Discharge Instructions      Your sugar was 261.  I think it is so high because of your being on prednisone .  Plus you had just eaten  before we did that blood work.  In  the next few days be really good on your diet, not eating any sugary foods like cakes or cookies or candy and not drinking any sugary drinks.  Just limit your starches to 1 serving with each meal.  Drink lots of water  If you feel worse anyway go to the emergency room  Our staff will notify you if there is anything significantly abnormal on the blood work, which was a blood count and your electrolytes.       ED Prescriptions   None    PDMP not reviewed this encounter.   Ann Keto, MD 05/12/23 [id number]

## 2023-05-12 NOTE — Discharge Instructions (Signed)
 Your sugar was 261.  I think it is so high because of your being on prednisone .  Plus you had just eaten before we did that blood work.  In the next few days be really good on your diet, not eating any sugary foods like cakes or cookies or candy and not drinking any sugary drinks.  Just limit your starches to 1 serving with each meal.  Drink lots of water  If you feel worse anyway go to the emergency room  Our staff will notify you if there is anything significantly abnormal on the blood work, which was a blood count and your electrolytes.

## 2023-05-14 LAB — CBC
Hematocrit: 45.8 % (ref 34.0–46.6)
Hemoglobin: 14.4 g/dL (ref 11.1–15.9)
MCH: 26.1 pg — ABNORMAL LOW (ref 26.6–33.0)
MCHC: 31.4 g/dL — ABNORMAL LOW (ref 31.5–35.7)
MCV: 83 fL (ref 79–97)
Platelets: 329 10*3/uL (ref 150–450)
RBC: 5.52 x10E6/uL — ABNORMAL HIGH (ref 3.77–5.28)
RDW: 14.1 % (ref 11.7–15.4)
WBC: 10.9 10*3/uL — ABNORMAL HIGH (ref 3.4–10.8)

## 2023-05-14 LAB — BASIC METABOLIC PANEL WITH GFR
BUN/Creatinine Ratio: 14 (ref 9–23)
BUN: 13 mg/dL (ref 6–24)
CO2: 20 mmol/L (ref 20–29)
Calcium: 9.4 mg/dL (ref 8.7–10.2)
Chloride: 104 mmol/L (ref 96–106)
Creatinine, Ser: 0.94 mg/dL (ref 0.57–1.00)
Glucose: 255 mg/dL — ABNORMAL HIGH (ref 70–99)
Potassium: 4.3 mmol/L (ref 3.5–5.2)
Sodium: 144 mmol/L (ref 134–144)
eGFR: 73 mL/min/{1.73_m2} (ref >59)

## 2023-05-16 ENCOUNTER — Telehealth: Payer: Self-pay | Admitting: Emergency Medicine

## 2023-05-16 NOTE — Telephone Encounter (Signed)
 Copied from CRM [id number]. Topic: Clinical - Prescription Issue >> May 16, 2023  2:50 PM Clydene Darner H wrote: Patient called regarding a CRM from 04/08/2023. She stated it has been over a month and she has not heard back. She reported that her Wegovy  prescription was denied by her insurance and is requesting an alternative medication that would be covered. The patient mentioned that [facility] faxed over paperwork last month but she has not received any updates. She also stated she had been communicating with one of the provider's nurses through the portal and was informed that the nurse was out of the office but has not received any follow-up since.  Callback Number: [phone]

## 2023-05-17 ENCOUNTER — Telehealth: Payer: Self-pay | Admitting: Internal Medicine

## 2023-05-17 ENCOUNTER — Encounter: Payer: Self-pay | Admitting: Radiology

## 2023-05-17 ENCOUNTER — Other Ambulatory Visit: Payer: Self-pay | Admitting: Emergency Medicine

## 2023-05-17 ENCOUNTER — Telehealth: Payer: Self-pay

## 2023-05-17 ENCOUNTER — Other Ambulatory Visit (HOSPITAL_COMMUNITY): Payer: Self-pay

## 2023-05-17 MED ORDER — TIRZEPATIDE-WEIGHT MANAGEMENT 2.5 MG/0.5ML ~~LOC~~ SOLN: 2.5000 mg | SUBCUTANEOUS | 5 refills | Status: DC

## 2023-05-17 NOTE — Telephone Encounter (Signed)
 PA request has been Started. New Encounter has been or will be created for follow up. For additional info see Pharmacy Prior Auth telephone encounter from 05/17/23.

## 2023-05-17 NOTE — Telephone Encounter (Signed)
 Pharmacy Patient Advocate Encounter   Received notification from Pt Calls Messages that prior authorization for Zepbound 2.5MG /0.5ML pen-injectors is required/requested.   Insurance verification completed.   The patient is insured through [facility] .   Per test claim: PA required; PA started via CoverMyMeds. KEY [id number] . Waiting for clinical questions to populate.

## 2023-05-17 NOTE — Telephone Encounter (Signed)
 New prescription for Zepbound sent to pharmacy of record today.

## 2023-05-17 NOTE — Telephone Encounter (Signed)
 Clinical questions have been answered and PA submitted. PA currently Pending.

## 2023-05-17 NOTE — Telephone Encounter (Signed)
 Copied from CRM [id number]. Topic: General - Other >> Apr 08, 2023  2:30 PM Adonis Hoot wrote: Reason for CRM: Patient called in to ask about wegovy  being approved through her insurance >> May 16, 2023  2:51 PM Clydene Darner H wrote: Patient called regarding a CRM from 04/08/2023. She stated it has been over a month and she has not heard back. She reported that her Wegovy  prescription was denied by her insurance and is requesting an alternative medication that would be covered. The patient mentioned that [facility] faxed over paperwork last month but she has not received any updates. She also stated she had been communicating with one of the provider's nurses through the portal and was informed that the nurse was out of the office but has not received any follow-up since.  Callback Number: [phone] >> Apr 08, 2023  2:34 PM Ottie Blonder wrote: Pt's prior Siegfried Dress has been started. We are waiting on ins response.

## 2023-05-18 ENCOUNTER — Telehealth: Payer: Self-pay

## 2023-05-18 ENCOUNTER — Encounter: Payer: Self-pay | Admitting: Radiology

## 2023-05-18 NOTE — Telephone Encounter (Signed)
 Copied from CRM [id number]. Topic: Clinical - Prescription Issue >> May 17, 2023  3:59 PM [place] E wrote: Reason for CRM: Patient got a call from [facility] stating the prior authorization for her Zepbound has been denied. Patient stated that her PCP is not putting enough information in the box, as that is what [facility] relayed to her. Patient did not know what specific information they still need. Callback number for patient is [phone].

## 2023-05-18 NOTE — Telephone Encounter (Signed)
 Pharmacy Patient Advocate Encounter  Received notification from [facility] that Prior Authorization for Zepbound 2.5MG /0.5ML pen-injectors  has been DENIED.  Full denial letter will be uploaded to the media tab. See denial reason below.   PA #/Case ID/Reference #: [id number]

## 2023-06-28 ENCOUNTER — Encounter: Payer: Self-pay | Admitting: Emergency Medicine

## 2023-06-28 ENCOUNTER — Ambulatory Visit: Admitting: Emergency Medicine

## 2023-06-28 VITALS — BP 138/92 | HR 93 | Temp 98.5°F | Ht 65.0 in | Wt 212.0 lb

## 2023-06-28 DIAGNOSIS — Z6834 Body mass index (BMI) 34.0-34.9, adult: Secondary | ICD-10-CM | POA: Diagnosis not present

## 2023-06-28 DIAGNOSIS — I10 Essential (primary) hypertension: Secondary | ICD-10-CM

## 2023-06-28 DIAGNOSIS — E66811 Obesity, class 1: Secondary | ICD-10-CM | POA: Diagnosis not present

## 2023-06-28 DIAGNOSIS — E6609 Other obesity due to excess calories: Secondary | ICD-10-CM | POA: Diagnosis not present

## 2023-06-28 NOTE — Assessment & Plan Note (Signed)
 Cardiovascular risks associated with obesity discussed Diet and nutrition discussed.  Advised to decrease amount of daily carbohydrate intake and daily calories and increase amount of plant based protein in her diet Medical insurance not covering GLP-1 agonists Was not able to get Wegovy 

## 2023-06-28 NOTE — Patient Instructions (Signed)
 Hypertension, Adult High blood pressure (hypertension) is when the force of blood pumping through the arteries is too strong. The arteries are the blood vessels that carry blood from the heart throughout the body. Hypertension forces the heart to work harder to pump blood and may cause arteries to become narrow or stiff. Untreated or uncontrolled hypertension can lead to a heart attack, heart failure, a stroke, kidney disease, and other problems. A blood pressure reading consists of a higher number over a lower number. Ideally, your blood pressure should be below 120/80. The first ("top") number is called the systolic pressure. It is a measure of the pressure in your arteries as your heart beats. The second ("bottom") number is called the diastolic pressure. It is a measure of the pressure in your arteries as the heart relaxes. What are the causes? The exact cause of this condition is not known. There are some conditions that result in high blood pressure. What increases the risk? Certain factors may make you more likely to develop high blood pressure. Some of these risk factors are under your control, including: Smoking. Not getting enough exercise or physical activity. Being overweight. Having too much fat, sugar, calories, or salt (sodium) in your diet. Drinking too much alcohol. Other risk factors include: Having a personal history of heart disease, diabetes, high cholesterol, or kidney disease. Stress. Having a family history of high blood pressure and high cholesterol. Having obstructive sleep apnea. Age. The risk increases with age. What are the signs or symptoms? High blood pressure may not cause symptoms. Very high blood pressure (hypertensive crisis) may cause: Headache. Fast or irregular heartbeats (palpitations). Shortness of breath. Nosebleed. Nausea and vomiting. Vision changes. Severe chest pain, dizziness, and seizures. How is this diagnosed? This condition is diagnosed by  measuring your blood pressure while you are seated, with your arm resting on a flat surface, your legs uncrossed, and your feet flat on the floor. The cuff of the blood pressure monitor will be placed directly against the skin of your upper arm at the level of your heart. Blood pressure should be measured at least twice using the same arm. Certain conditions can cause a difference in blood pressure between your right and left arms. If you have a high blood pressure reading during one visit or you have normal blood pressure with other risk factors, you may be asked to: Return on a different day to have your blood pressure checked again. Monitor your blood pressure at home for 1 week or longer. If you are diagnosed with hypertension, you may have other blood or imaging tests to help your health care provider understand your overall risk for other conditions. How is this treated? This condition is treated by making healthy lifestyle changes, such as eating healthy foods, exercising more, and reducing your alcohol intake. You may be referred for counseling on a healthy diet and physical activity. Your health care provider may prescribe medicine if lifestyle changes are not enough to get your blood pressure under control and if: Your systolic blood pressure is above 130. Your diastolic blood pressure is above 80. Your personal target blood pressure may vary depending on your medical conditions, your age, and other factors. Follow these instructions at home: Eating and drinking  Eat a diet that is high in fiber and potassium, and low in sodium, added sugar, and fat. An example of this eating plan is called the DASH diet. DASH stands for Dietary Approaches to Stop Hypertension. To eat this way: Eat  plenty of fresh fruits and vegetables. Try to fill one half of your plate at each meal with fruits and vegetables. Eat whole grains, such as whole-wheat pasta, brown rice, or whole-grain bread. Fill about one  fourth of your plate with whole grains. Eat or drink low-fat dairy products, such as skim milk or low-fat yogurt. Avoid fatty cuts of meat, processed or cured meats, and poultry with skin. Fill about one fourth of your plate with lean proteins, such as fish, chicken without skin, beans, eggs, or tofu. Avoid pre-made and processed foods. These tend to be higher in sodium, added sugar, and fat. Reduce your daily sodium intake. Many people with hypertension should eat less than 1,500 mg of sodium a day. Do not drink alcohol if: Your health care provider tells you not to drink. You are pregnant, may be pregnant, or are planning to become pregnant. If you drink alcohol: Limit how much you have to: 0-1 drink a day for women. 0-2 drinks a day for men. Know how much alcohol is in your drink. In the [place] one drink equals one 12 oz bottle of beer (355 mL), one 5 oz glass of wine (148 mL), or one 1 oz glass of hard liquor (44 mL). Lifestyle  Work with your health care provider to maintain a healthy body weight or to lose weight. Ask what an ideal weight is for you. Get at least 30 minutes of exercise that causes your heart to beat faster (aerobic exercise) most days of the week. Activities may include walking, swimming, or biking. Include exercise to strengthen your muscles (resistance exercise), such as Pilates or lifting weights, as part of your weekly exercise routine. Try to do these types of exercises for 30 minutes at least 3 days a week. Do not use any products that contain nicotine or tobacco. These products include cigarettes, chewing tobacco, and vaping devices, such as e-cigarettes. If you need help quitting, ask your health care provider. Monitor your blood pressure at home as told by your health care provider. Keep all follow-up visits. This is important. Medicines Take over-the-counter and prescription medicines only as told by your health care provider. Follow directions carefully. Blood  pressure medicines must be taken as prescribed. Do not skip doses of blood pressure medicine. Doing this puts you at risk for problems and can make the medicine less effective. Ask your health care provider about side effects or reactions to medicines that you should watch for. Contact a health care provider if you: Think you are having a reaction to a medicine you are taking. Have headaches that keep coming back (recurring). Feel dizzy. Have swelling in your ankles. Have trouble with your vision. Get help right away if you: Develop a severe headache or confusion. Have unusual weakness or numbness. Feel faint. Have severe pain in your chest or abdomen. Vomit repeatedly. Have trouble breathing. These symptoms may be an emergency. Get help right away. Call 911. Do not wait to see if the symptoms will go away. Do not drive yourself to the hospital. Summary Hypertension is when the force of blood pumping through your arteries is too strong. If this condition is not controlled, it may put you at risk for serious complications. Your personal target blood pressure may vary depending on your medical conditions, your age, and other factors. For most people, a normal blood pressure is less than 120/80. Hypertension is treated with lifestyle changes, medicines, or a combination of both. Lifestyle changes include losing weight, eating a healthy,  low-sodium diet, exercising more, and limiting alcohol. This information is not intended to replace advice given to you by your health care provider. Make sure you discuss any questions you have with your health care provider. Document Revised: 11/11/2020 Document Reviewed: 11/11/2020 Elsevier Patient Education  2024 [employer].
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
## 2023-06-28 NOTE — Assessment & Plan Note (Addendum)
 Elevated blood pressure reading in the office but normal at home. Has not taken medication today yet. Cardiovascular risk associated with hypertension discussed Diet and nutrition discussed Continue amlodipine 10 mg daily and metoprolol succinate 25 mg daily Follow-up in 6 months

## 2023-07-05 ENCOUNTER — Encounter: Payer: Self-pay | Admitting: Emergency Medicine

## 2023-07-14 ENCOUNTER — Ambulatory Visit: Admitting: Emergency Medicine

## 2023-12-28 ENCOUNTER — Ambulatory Visit: Admitting: Emergency Medicine

## 2024-01-03 ENCOUNTER — Ambulatory Visit: Admitting: Emergency Medicine

## 2024-01-03 ENCOUNTER — Encounter: Payer: Self-pay | Admitting: Emergency Medicine

## 2024-01-03 VITALS — BP 140/120 | HR 85 | Temp 98.0°F | Ht 65.0 in | Wt 213.0 lb

## 2024-01-03 DIAGNOSIS — I10 Essential (primary) hypertension: Secondary | ICD-10-CM

## 2024-01-03 DIAGNOSIS — E66811 Obesity, class 1: Secondary | ICD-10-CM

## 2024-01-03 MED ORDER — AMLODIPINE BESYLATE 10 MG PO TABS: 10.0000 mg | ORAL_TABLET | Freq: Every day | ORAL | 3 refills | Status: AC

## 2024-01-03 NOTE — Progress Notes (Signed)
 Mary Guerrero 53 y.o.   Chief Complaint  Patient presents with   Follow-up    No concerns     HISTORY OF PRESENT ILLNESS: This is a 53 y.o. female here for 76-month follow-up of hypertension. Has been off amlodipine  for the last 6 months.  Ran out of refills and did not request this office for more. Presently only taking metoprolol succinate 25 mg daily Asymptomatic. No other complaints or medical concerns today.  HPI   Prior to Admission medications  Medication Sig Start Date End Date Taking? Authorizing Provider  metoprolol succinate (TOPROL-XL) 25 MG 24 hr tablet Take 1 tablet by mouth daily. 02/08/23  Yes [provider]  amLODipine  (NORVASC ) 10 MG tablet Take 1 tablet (10 mg total) by mouth daily. 01/03/24   Purcell Emil Schanz, MD  pantoprazole  (PROTONIX ) 20 MG tablet Take 1 tablet (20 mg total) by mouth daily. Patient not taking: Reported on 01/03/2024 01/11/23   Trine Raynell Moder, MD    Allergies[1]  Patient Active Problem List   Diagnosis Date Noted   Essential hypertension 04/07/2023   Class 1 obesity due to excess calories with serious comorbidity and body mass index (BMI) of 34.0 to 34.9 in adult 04/07/2023    Past Medical History:  Diagnosis Date   Hypertension     Past Surgical History:  Procedure Laterality Date   ABDOMINAL HYSTERECTOMY  partial    Social History   Socioeconomic History   Marital status: Single    Spouse name: Not on file   Number of children: 2   Years of education: Not on file   Highest education level: Some college, no degree  Occupational History   Not on file  Tobacco Use   Smoking status: Never   Smokeless tobacco: Never  Vaping Use   Vaping status: Never Used  Substance and Sexual Activity   Alcohol use: Yes    Alcohol/week: 2.0 standard drinks of alcohol    Types: 2 Glasses of wine per week    Comment: occassional   Drug use: No   Sexual activity: Not Currently  Other Topics Concern   Not on  file  Social History Narrative   Not on file   Social Drivers of Health   Tobacco Use: Low Risk (01/03/2024)   Patient History    Smoking Tobacco Use: Never    Smokeless Tobacco Use: Never    Passive Exposure: Not on file  Financial Resource Strain: Medium Risk (06/24/2023)   Overall Financial Resource Strain (CARDIA)    Difficulty of Paying Living Expenses: Somewhat hard  Food Insecurity: Food Insecurity Present (06/24/2023)   Hunger Vital Sign    Worried About Running Out of Food in the Last Year: Sometimes true    Ran Out of Food in the Last Year: Sometimes true  Transportation Needs: No Transportation Needs (06/24/2023)   PRAPARE - Administrator, Civil Service (Medical): No    Lack of Transportation (Non-Medical): No  Physical Activity: Insufficiently Active (06/24/2023)   Exercise Vital Sign    Days of Exercise per Week: 1 day    Minutes of Exercise per Session: 10 min  Stress: No Stress Concern Present (06/24/2023)   [employer] of Occupational Health - Occupational Stress Questionnaire    Feeling of Stress : Only a little  Social Connections: Socially Isolated (06/24/2023)   Social Connection and Isolation Panel    Frequency of Communication with Friends and Family: More than three times a week  Frequency of Social Gatherings with Friends and Family: Once a week    Attends Religious Services: Never    Database Administrator or Organizations: No    Attends Engineer, Structural: Not on file    Marital Status: Divorced  Intimate Partner Violence: Not on file  Depression (PHQ2-9): Low Risk (01/03/2024)   Depression (PHQ2-9)    PHQ-2 Score: 0  Alcohol Screen: Low Risk (06/24/2023)   Alcohol Screen    Last Alcohol Screening Score (AUDIT): 1  Housing: High Risk (06/24/2023)   Housing Stability Vital Sign    Unable to Pay for Housing in the Last Year: Yes    Number of Times Moved in the Last Year: 0    Homeless in the Last Year: No  Utilities: Not on file   Health Literacy: Not on file    Family History  Problem Relation Age of Onset   Ovarian cancer Mother      Review of Systems  Constitutional: Negative.  Negative for chills and fever.  HENT: Negative.  Negative for congestion and sore throat.   Respiratory: Negative.  Negative for cough and shortness of breath.   Cardiovascular: Negative.  Negative for chest pain and palpitations.  Gastrointestinal:  Negative for abdominal pain, diarrhea, nausea and vomiting.  Genitourinary: Negative.  Negative for dysuria and hematuria.  Skin: Negative.  Negative for rash.  Neurological: Negative.  Negative for dizziness and headaches.  All other systems reviewed and are negative.   Vitals:   01/03/24 1451  BP: (!) 140/120  Pulse: 85  Temp: 98 F (36.7 C)  SpO2: 95%    Physical Exam Vitals reviewed.  Constitutional:      Appearance: Normal appearance.  HENT:     Head: Normocephalic.     Mouth/Throat:     Mouth: Mucous membranes are moist.     Pharynx: Oropharynx is clear.  Eyes:     Extraocular Movements: Extraocular movements intact.     Conjunctiva/sclera: Conjunctivae normal.     Pupils: Pupils are equal, round, and reactive to light.  Cardiovascular:     Rate and Rhythm: Normal rate and regular rhythm.     Pulses: Normal pulses.     Heart sounds: Normal heart sounds.  Pulmonary:     Effort: Pulmonary effort is normal.     Breath sounds: Normal breath sounds.  Abdominal:     Palpations: Abdomen is soft.     Tenderness: There is no abdominal tenderness.  Musculoskeletal:     Cervical back: No tenderness.  Lymphadenopathy:     Cervical: No cervical adenopathy.  Skin:    General: Skin is warm and dry.     Capillary Refill: Capillary refill takes less than 2 seconds.  Neurological:     General: No focal deficit present.     Mental Status: She is alert and oriented to person, place, and time.  Psychiatric:        Mood and Affect: Mood normal.        Behavior: Behavior  normal.      ASSESSMENT & PLAN: A total of 40 minutes was spent with the patient and counseling/coordination of care regarding preparing for this visit, review of most recent office visit notes, diagnosis of hypertension and cardiovascular risks associated with uncontrolled hypertension, review of all medications and changes made, review of most recent bloodwork results, review of health maintenance items, education on nutrition, prognosis, documentation, and need for follow up.   Problem List Items Addressed This  Visit       Cardiovascular and Mediastinum   Essential hypertension - Primary   BP Readings from Last 3 Encounters:  01/03/24 (!) 140/120  06/28/23 (!) 138/92  05/12/23 134/78  Elevated blood pressure readings in the office and at home Cardiovascular risks associated with uncontrolled hypertension discussed Dietary approaches to stop hypertension discussed Benefits of exercise discussed Has been off amlodipine  for the last 6 months Only taking metoprolol succinate 25 mg daily.  Will continue Restart amlodipine  10 mg daily Advised to monitor blood pressure readings at home daily for the next couple of weeks and contact the office if numbers persistently abnormal. Follow-up in 3 months       Relevant Medications   amLODipine  (NORVASC ) 10 MG tablet     Other   Class 1 obesity due to excess calories with serious comorbidity and body mass index (BMI) of 34.0 to 34.9 in adult   Cardiovascular risks associated with obesity discussed Diet and nutrition discussed.  Advised to decrease amount of daily carbohydrate intake and daily calories and increase amount of plant based protein in her diet Medical insurance not covering GLP-1 agonists Was not able to get Wegovy       Patient Instructions  Hypertension, Adult High blood pressure (hypertension) is when the force of blood pumping through the arteries is too strong. The arteries are the blood vessels that carry blood from  the heart throughout the body. Hypertension forces the heart to work harder to pump blood and may cause arteries to become narrow or stiff. Untreated or uncontrolled hypertension can lead to a heart attack, heart failure, a stroke, kidney disease, and other problems. A blood pressure reading consists of a higher number over a lower number. Ideally, your blood pressure should be below 120/80. The first (top) number is called the systolic pressure. It is a measure of the pressure in your arteries as your heart beats. The second (bottom) number is called the diastolic pressure. It is a measure of the pressure in your arteries as the heart relaxes. What are the causes? The exact cause of this condition is not known. There are some conditions that result in high blood pressure. What increases the risk? Certain factors may make you more likely to develop high blood pressure. Some of these risk factors are under your control, including: Smoking. Not getting enough exercise or physical activity. Being overweight. Having too much fat, sugar, calories, or salt (sodium) in your diet. Drinking too much alcohol. Other risk factors include: Having a personal history of heart disease, diabetes, high cholesterol, or kidney disease. Stress. Having a family history of high blood pressure and high cholesterol. Having obstructive sleep apnea. Age. The risk increases with age. What are the signs or symptoms? High blood pressure may not cause symptoms. Very high blood pressure (hypertensive crisis) may cause: Headache. Fast or irregular heartbeats (palpitations). Shortness of breath. Nosebleed. Nausea and vomiting. Vision changes. Severe chest pain, dizziness, and seizures. How is this diagnosed? This condition is diagnosed by measuring your blood pressure while you are seated, with your arm resting on a flat surface, your legs uncrossed, and your feet flat on the floor. The cuff of the blood pressure  monitor will be placed directly against the skin of your upper arm at the level of your heart. Blood pressure should be measured at least twice using the same arm. Certain conditions can cause a difference in blood pressure between your right and left arms. If you have a high blood  pressure reading during one visit or you have normal blood pressure with other risk factors, you may be asked to: Return on a different day to have your blood pressure checked again. Monitor your blood pressure at home for 1 week or longer. If you are diagnosed with hypertension, you may have other blood or imaging tests to help your health care provider understand your overall risk for other conditions. How is this treated? This condition is treated by making healthy lifestyle changes, such as eating healthy foods, exercising more, and reducing your alcohol intake. You may be referred for counseling on a healthy diet and physical activity. Your health care provider may prescribe medicine if lifestyle changes are not enough to get your blood pressure under control and if: Your systolic blood pressure is above 130. Your diastolic blood pressure is above 80. Your personal target blood pressure may vary depending on your medical conditions, your age, and other factors. Follow these instructions at home: Eating and drinking  Eat a diet that is high in fiber and potassium, and low in sodium, added sugar, and fat. An example of this eating plan is called the DASH diet. DASH stands for Dietary Approaches to Stop Hypertension. To eat this way: Eat plenty of fresh fruits and vegetables. Try to fill one half of your plate at each meal with fruits and vegetables. Eat whole grains, such as whole-wheat pasta, brown rice, or whole-grain bread. Fill about one fourth of your plate with whole grains. Eat or drink low-fat dairy products, such as skim milk or low-fat yogurt. Avoid fatty cuts of meat, processed or cured meats, and poultry  with skin. Fill about one fourth of your plate with lean proteins, such as fish, chicken without skin, beans, eggs, or tofu. Avoid pre-made and processed foods. These tend to be higher in sodium, added sugar, and fat. Reduce your daily sodium intake. Many people with hypertension should eat less than 1,500 mg of sodium a day. Do not drink alcohol if: Your health care provider tells you not to drink. You are pregnant, may be pregnant, or are planning to become pregnant. If you drink alcohol: Limit how much you have to: 0-1 drink a day for women. 0-2 drinks a day for men. Know how much alcohol is in your drink. In the [place] one drink equals one 12 oz bottle of beer (355 mL), one 5 oz glass of wine (148 mL), or one 1 oz glass of hard liquor (44 mL). Lifestyle  Work with your health care provider to maintain a healthy body weight or to lose weight. Ask what an ideal weight is for you. Get at least 30 minutes of exercise that causes your heart to beat faster (aerobic exercise) most days of the week. Activities may include walking, swimming, or biking. Include exercise to strengthen your muscles (resistance exercise), such as Pilates or lifting weights, as part of your weekly exercise routine. Try to do these types of exercises for 30 minutes at least 3 days a week. Do not use any products that contain nicotine or tobacco. These products include cigarettes, chewing tobacco, and vaping devices, such as e-cigarettes. If you need help quitting, ask your health care provider. Monitor your blood pressure at home as told by your health care provider. Keep all follow-up visits. This is important. Medicines Take over-the-counter and prescription medicines only as told by your health care provider. Follow directions carefully. Blood pressure medicines must be taken as prescribed. Do not skip doses of blood pressure  medicine. Doing this puts you at risk for problems and can make the medicine less  effective. Ask your health care provider about side effects or reactions to medicines that you should watch for. Contact a health care provider if you: Think you are having a reaction to a medicine you are taking. Have headaches that keep coming back (recurring). Feel dizzy. Have swelling in your ankles. Have trouble with your vision. Get help right away if you: Develop a severe headache or confusion. Have unusual weakness or numbness. Feel faint. Have severe pain in your chest or abdomen. Vomit repeatedly. Have trouble breathing. These symptoms may be an emergency. Get help right away. Call 911. Do not wait to see if the symptoms will go away. Do not drive yourself to the hospital. Summary Hypertension is when the force of blood pumping through your arteries is too strong. If this condition is not controlled, it may put you at risk for serious complications. Your personal target blood pressure may vary depending on your medical conditions, your age, and other factors. For most people, a normal blood pressure is less than 120/80. Hypertension is treated with lifestyle changes, medicines, or a combination of both. Lifestyle changes include losing weight, eating a healthy, low-sodium diet, exercising more, and limiting alcohol. This information is not intended to replace advice given to you by your health care provider. Make sure you discuss any questions you have with your health care provider. Document Revised: 11/11/2020 Document Reviewed: 11/11/2020 Elsevier Patient Education  2024 [employer]     Emil Schaumann, MD [facility]    [1] No Known Allergies

## 2024-01-03 NOTE — Assessment & Plan Note (Addendum)
 BP Readings from Last 3 Encounters:  01/03/24 (!) 140/120  06/28/23 (!) 138/92  05/12/23 134/78  Elevated blood pressure readings in the office and at home Cardiovascular risks associated with uncontrolled hypertension discussed Dietary approaches to stop hypertension discussed Benefits of exercise discussed Has been off amlodipine  for the last 6 months Only taking metoprolol succinate 25 mg daily.  Will continue Restart amlodipine  10 mg daily Advised to monitor blood pressure readings at home daily for the next couple of weeks and contact the office if numbers persistently abnormal. Follow-up in 3 months

## 2024-01-03 NOTE — Assessment & Plan Note (Signed)
 Cardiovascular risks associated with obesity discussed Diet and nutrition discussed.  Advised to decrease amount of daily carbohydrate intake and daily calories and increase amount of plant based protein in her diet Medical insurance not covering GLP-1 agonists Was not able to get Wegovy 

## 2024-01-03 NOTE — Patient Instructions (Signed)
 Hypertension, Adult High blood pressure (hypertension) is when the force of blood pumping through the arteries is too strong. The arteries are the blood vessels that carry blood from the heart throughout the body. Hypertension forces the heart to work harder to pump blood and may cause arteries to become narrow or stiff. Untreated or uncontrolled hypertension can lead to a heart attack, heart failure, a stroke, kidney disease, and other problems. A blood pressure reading consists of a higher number over a lower number. Ideally, your blood pressure should be below 120/80. The first ("top") number is called the systolic pressure. It is a measure of the pressure in your arteries as your heart beats. The second ("bottom") number is called the diastolic pressure. It is a measure of the pressure in your arteries as the heart relaxes. What are the causes? The exact cause of this condition is not known. There are some conditions that result in high blood pressure. What increases the risk? Certain factors may make you more likely to develop high blood pressure. Some of these risk factors are under your control, including: Smoking. Not getting enough exercise or physical activity. Being overweight. Having too much fat, sugar, calories, or salt (sodium) in your diet. Drinking too much alcohol. Other risk factors include: Having a personal history of heart disease, diabetes, high cholesterol, or kidney disease. Stress. Having a family history of high blood pressure and high cholesterol. Having obstructive sleep apnea. Age. The risk increases with age. What are the signs or symptoms? High blood pressure may not cause symptoms. Very high blood pressure (hypertensive crisis) may cause: Headache. Fast or irregular heartbeats (palpitations). Shortness of breath. Nosebleed. Nausea and vomiting. Vision changes. Severe chest pain, dizziness, and seizures. How is this diagnosed? This condition is diagnosed by  measuring your blood pressure while you are seated, with your arm resting on a flat surface, your legs uncrossed, and your feet flat on the floor. The cuff of the blood pressure monitor will be placed directly against the skin of your upper arm at the level of your heart. Blood pressure should be measured at least twice using the same arm. Certain conditions can cause a difference in blood pressure between your right and left arms. If you have a high blood pressure reading during one visit or you have normal blood pressure with other risk factors, you may be asked to: Return on a different day to have your blood pressure checked again. Monitor your blood pressure at home for 1 week or longer. If you are diagnosed with hypertension, you may have other blood or imaging tests to help your health care provider understand your overall risk for other conditions. How is this treated? This condition is treated by making healthy lifestyle changes, such as eating healthy foods, exercising more, and reducing your alcohol intake. You may be referred for counseling on a healthy diet and physical activity. Your health care provider may prescribe medicine if lifestyle changes are not enough to get your blood pressure under control and if: Your systolic blood pressure is above 130. Your diastolic blood pressure is above 80. Your personal target blood pressure may vary depending on your medical conditions, your age, and other factors. Follow these instructions at home: Eating and drinking  Eat a diet that is high in fiber and potassium, and low in sodium, added sugar, and fat. An example of this eating plan is called the DASH diet. DASH stands for Dietary Approaches to Stop Hypertension. To eat this way: Eat  plenty of fresh fruits and vegetables. Try to fill one half of your plate at each meal with fruits and vegetables. Eat whole grains, such as whole-wheat pasta, brown rice, or whole-grain bread. Fill about one  fourth of your plate with whole grains. Eat or drink low-fat dairy products, such as skim milk or low-fat yogurt. Avoid fatty cuts of meat, processed or cured meats, and poultry with skin. Fill about one fourth of your plate with lean proteins, such as fish, chicken without skin, beans, eggs, or tofu. Avoid pre-made and processed foods. These tend to be higher in sodium, added sugar, and fat. Reduce your daily sodium intake. Many people with hypertension should eat less than 1,500 mg of sodium a day. Do not drink alcohol if: Your health care provider tells you not to drink. You are pregnant, may be pregnant, or are planning to become pregnant. If you drink alcohol: Limit how much you have to: 0-1 drink a day for women. 0-2 drinks a day for men. Know how much alcohol is in your drink. In the [place] one drink equals one 12 oz bottle of beer (355 mL), one 5 oz glass of wine (148 mL), or one 1 oz glass of hard liquor (44 mL). Lifestyle  Work with your health care provider to maintain a healthy body weight or to lose weight. Ask what an ideal weight is for you. Get at least 30 minutes of exercise that causes your heart to beat faster (aerobic exercise) most days of the week. Activities may include walking, swimming, or biking. Include exercise to strengthen your muscles (resistance exercise), such as Pilates or lifting weights, as part of your weekly exercise routine. Try to do these types of exercises for 30 minutes at least 3 days a week. Do not use any products that contain nicotine or tobacco. These products include cigarettes, chewing tobacco, and vaping devices, such as e-cigarettes. If you need help quitting, ask your health care provider. Monitor your blood pressure at home as told by your health care provider. Keep all follow-up visits. This is important. Medicines Take over-the-counter and prescription medicines only as told by your health care provider. Follow directions carefully. Blood  pressure medicines must be taken as prescribed. Do not skip doses of blood pressure medicine. Doing this puts you at risk for problems and can make the medicine less effective. Ask your health care provider about side effects or reactions to medicines that you should watch for. Contact a health care provider if you: Think you are having a reaction to a medicine you are taking. Have headaches that keep coming back (recurring). Feel dizzy. Have swelling in your ankles. Have trouble with your vision. Get help right away if you: Develop a severe headache or confusion. Have unusual weakness or numbness. Feel faint. Have severe pain in your chest or abdomen. Vomit repeatedly. Have trouble breathing. These symptoms may be an emergency. Get help right away. Call 911. Do not wait to see if the symptoms will go away. Do not drive yourself to the hospital. Summary Hypertension is when the force of blood pumping through your arteries is too strong. If this condition is not controlled, it may put you at risk for serious complications. Your personal target blood pressure may vary depending on your medical conditions, your age, and other factors. For most people, a normal blood pressure is less than 120/80. Hypertension is treated with lifestyle changes, medicines, or a combination of both. Lifestyle changes include losing weight, eating a healthy,  low-sodium diet, exercising more, and limiting alcohol. This information is not intended to replace advice given to you by your health care provider. Make sure you discuss any questions you have with your health care provider. Document Revised: 11/11/2020 Document Reviewed: 11/11/2020 Elsevier Patient Education  2024 [employer].

## 2024-04-03 ENCOUNTER — Ambulatory Visit: Admitting: Emergency Medicine
